# Patient Record
Sex: Male | Born: 1940 | Race: White | Hispanic: No | Smoking: Former smoker
Health system: Southern US, Community
[De-identification: ages and names within clinical notes are randomized; demographics above are authoritative.]

## PROBLEM LIST (undated history)

## (undated) DIAGNOSIS — J449 Chronic obstructive pulmonary disease, unspecified: Secondary | ICD-10-CM

## (undated) DIAGNOSIS — D689 Coagulation defect, unspecified: Secondary | ICD-10-CM

## (undated) DIAGNOSIS — T7840XA Allergy, unspecified, initial encounter: Secondary | ICD-10-CM

## (undated) DIAGNOSIS — H269 Unspecified cataract: Secondary | ICD-10-CM

## (undated) DIAGNOSIS — Z972 Presence of dental prosthetic device (complete) (partial): Secondary | ICD-10-CM

## (undated) DIAGNOSIS — E119 Type 2 diabetes mellitus without complications: Secondary | ICD-10-CM

## (undated) DIAGNOSIS — E7211 Homocystinuria: Secondary | ICD-10-CM

## (undated) DIAGNOSIS — E039 Hypothyroidism, unspecified: Secondary | ICD-10-CM

## (undated) DIAGNOSIS — R0600 Dyspnea, unspecified: Secondary | ICD-10-CM

## (undated) DIAGNOSIS — I739 Peripheral vascular disease, unspecified: Secondary | ICD-10-CM

## (undated) DIAGNOSIS — D649 Anemia, unspecified: Secondary | ICD-10-CM

## (undated) HISTORY — DX: Coagulation defect, unspecified: D68.9

## (undated) HISTORY — DX: Chronic obstructive pulmonary disease, unspecified: J44.9

## (undated) HISTORY — PX: APPENDECTOMY: SHX54

## (undated) HISTORY — DX: Type 2 diabetes mellitus without complications: E11.9

## (undated) HISTORY — PX: CORONARY ANGIOPLASTY WITH STENT PLACEMENT: SHX49

## (undated) HISTORY — PX: COLONOSCOPY: SHX174

## (undated) HISTORY — DX: Unspecified cataract: H26.9

## (undated) HISTORY — DX: Allergy, unspecified, initial encounter: T78.40XA

## (undated) HISTORY — DX: Anemia, unspecified: D64.9

---

## 1994-11-12 HISTORY — PX: FEMORAL-POPLITEAL BYPASS GRAFT: SHX937

## 1994-11-12 HISTORY — PX: ABDOMINAL AORTIC ENDOVASCULAR STENT GRAFT: SHX5707

## 1999-11-13 HISTORY — PX: CAROTID-SUBCLAVIAN BYPASS GRAFT: SHX910

## 2004-09-30 ENCOUNTER — Emergency Department: Payer: Self-pay | Admitting: Emergency Medicine

## 2005-10-16 ENCOUNTER — Ambulatory Visit: Payer: Self-pay | Admitting: Internal Medicine

## 2007-02-12 ENCOUNTER — Ambulatory Visit: Payer: Self-pay | Admitting: Vascular Surgery

## 2007-06-16 ENCOUNTER — Ambulatory Visit: Payer: Self-pay | Admitting: Gastroenterology

## 2007-06-26 ENCOUNTER — Ambulatory Visit: Payer: Self-pay | Admitting: Gastroenterology

## 2009-06-12 IMAGING — CT CT CHEST-ABD-PELV W/ CM
1 of 2 series · 13 of 29 positions shown, 17 images · non-contrast
Comparison: none

REASON FOR EXAM: Weight loss, patient is diabetic and on Metformin
COMMENTS:    Allergic to shellfish

[Series 2: soft tissue · axial · 0.83mm/px · z∈[-551,+44]mm · 13 of 135 slices shown, 17 images]
[im 8/135  mediastinal]
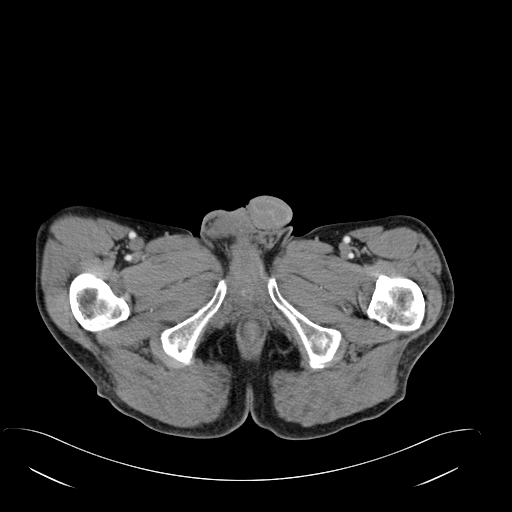
[im 8/135  bone]
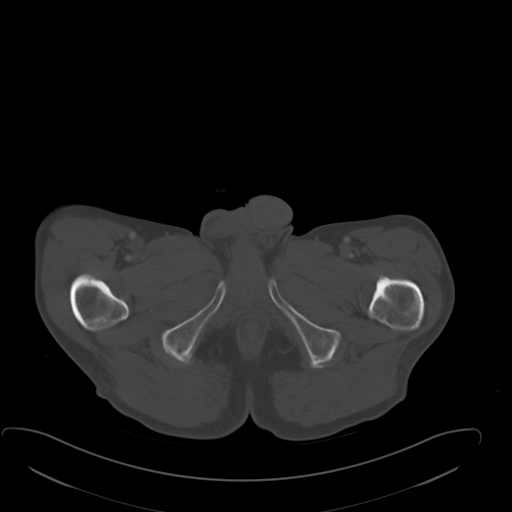
[im 23/135  mediastinal]
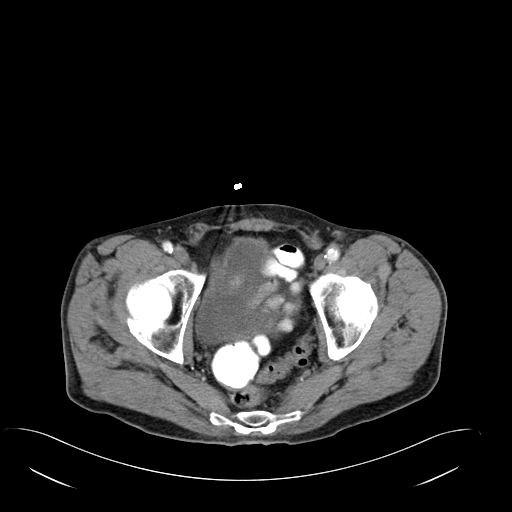
[im 38/135  mediastinal]
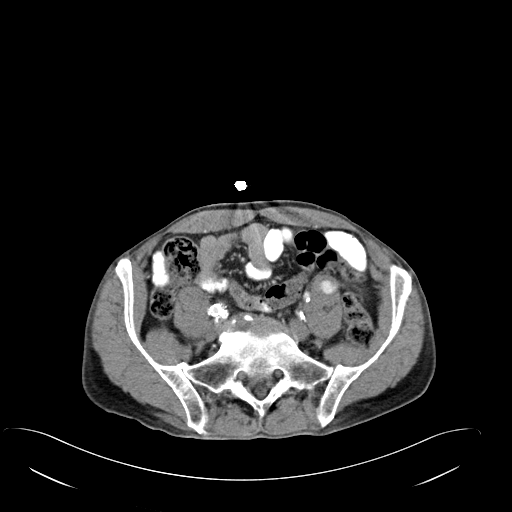
[im 45/135  mediastinal]
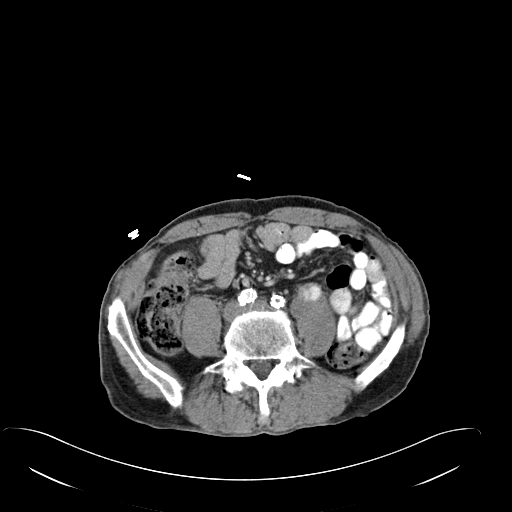
[im 60/135  mediastinal]
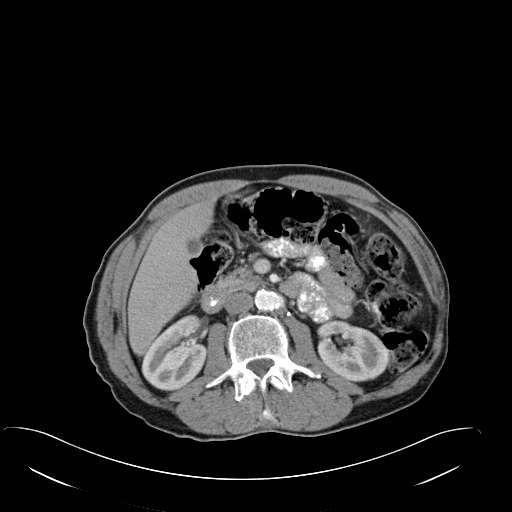
[im 68/135  mediastinal]
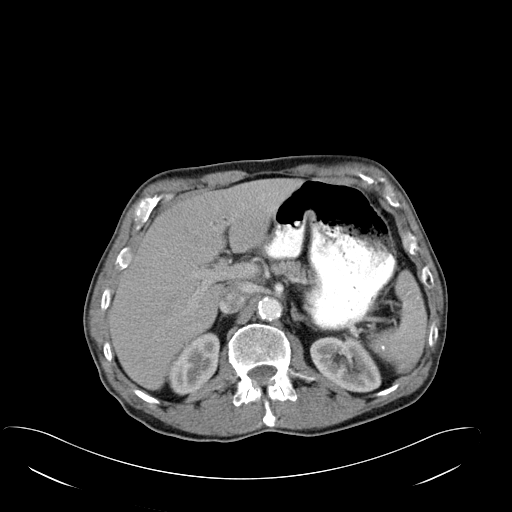
[im 75/135  mediastinal]
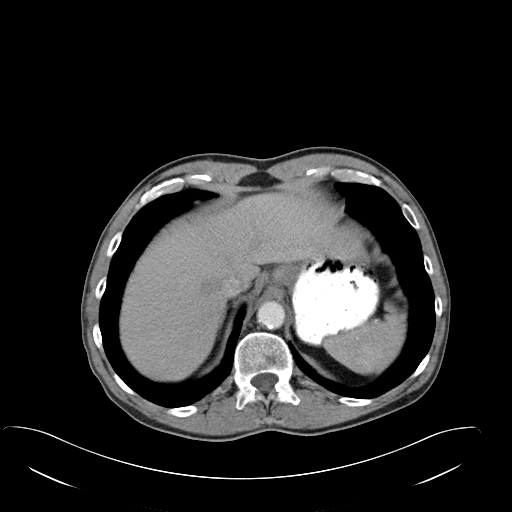
[im 90/135  mediastinal]
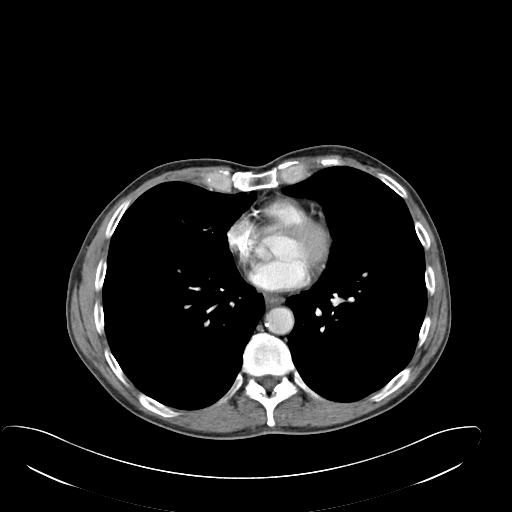
[im 97/135  mediastinal]
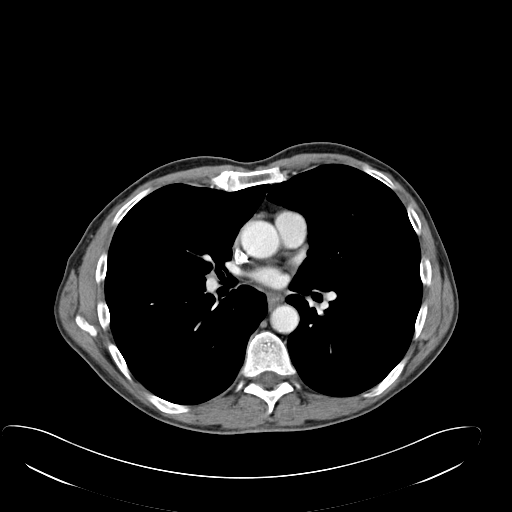
[im 97/135  bone]
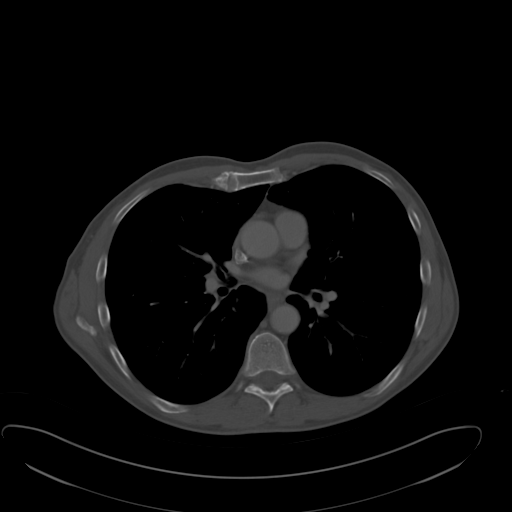
[im 105/135  lung]
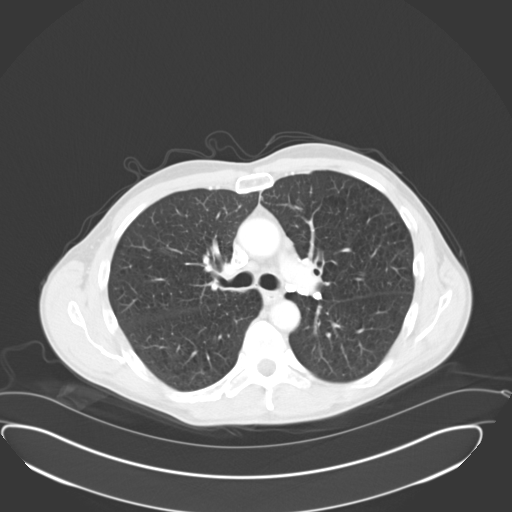
[im 112/135  mediastinal]
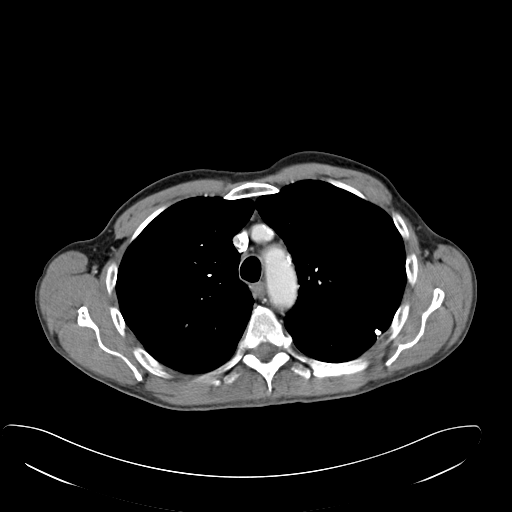
[im 112/135  lung]
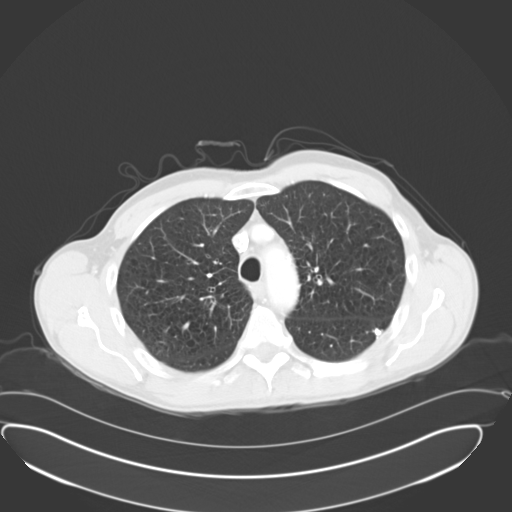
[im 120/135  lung]
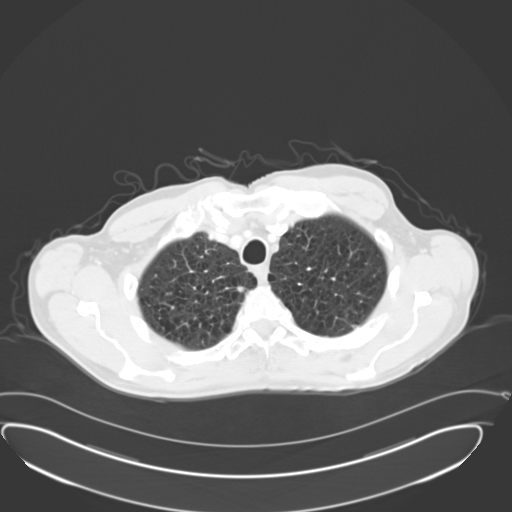
[im 127/135  mediastinal]
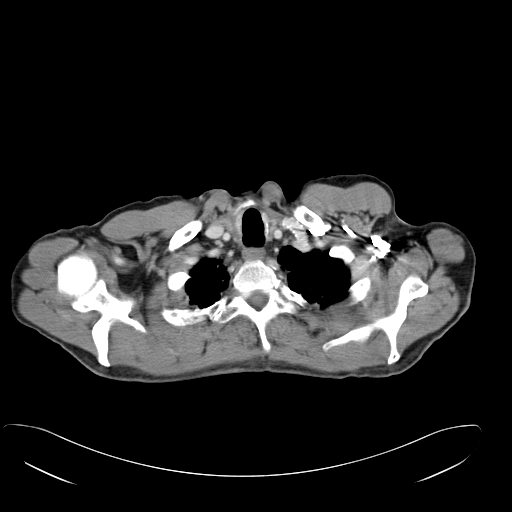
[im 127/135  lung]
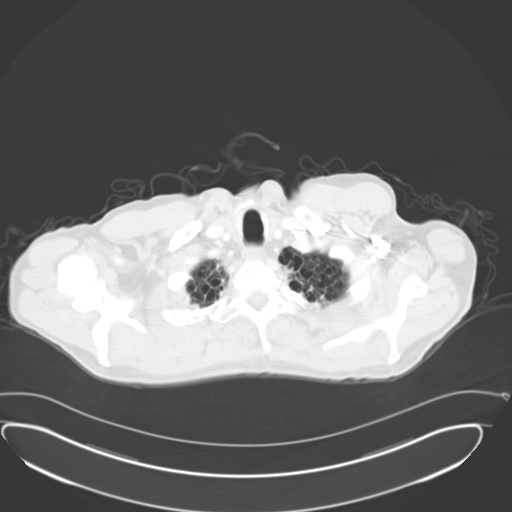

[13 of 29 positions shown; findings below may reference images not displayed]

PROCEDURE:     CT  - CT CHEST ABDOMEN AND PELVIS W  - June 26, 2007  [DATE]

RESULT:     The patient is being evaluated for unexplained weight loss. The
patient underwent a desensitization protocol due to shellfish allergy.

CT SCAN OF CHEST:  The patient received 85 ml of Csovue-OOZ for this study.

Within the mediastinum and hilar regions no pathologic size lymph nodes are
seen. No masses are identified. The cardiac chambers are not enlarged. The
caliber of the thoracic aorta is normal. The retrosternal soft tissues are
normal in appearance. There is no axillary lymphadenopathy. There is no
pleural or pericardial effusion.

At lung window settings, there are emphysematous changes bilaterally. There
is apical pleural scarring. There is a calcified, subpleural nodule in the
LEFT upper lobe demonstrated best on image #17. A second, calcified,
subpleural nodule in the extreme anterolateral aspect of the superior
segment of the LEFT lower lobe is seen on image #24. No alveolar infiltrates
or soft tissue density masses are identified.

The thoracic vertebral bodies are preserved in height. No abnormal
paravertebral soft tissue densities are seen.
CONCLUSION: I do not see findings to suggest intrathoracic malignancy or
other acute intrathoracic abnormality. There is evidence of prior
granulomatous infection of the lungs and there are changes of COPD with
small bullous lesions in the apices.

CT SCAN OF ABDOMEN AND PELVIS: The patient received the aforementioned IV
contrast as well as oral contrast material.

The liver exhibits scattered calcifications consistent with prior
granulomatous infection. There is no evidence of a soft tissue density mass
or ductal dilation. The spleen is normal in size and also exhibits punctate
calcifications. The stomach, pancreas, gallbladder, adrenal glands and
kidneys are normal in appearance. The caliber of the thoracic aorta is
within the limits of normal. The patient may have undergone prior vascular
grafting with there being a vessel present extending from the distal
abdominal aorta to the external iliac artery. There is dense calcification
in the wall of the common iliac arteries as well as the internal and
external iliac arteries with no definite residual flow seen in the native
external iliac artery on the LEFT.

Within the pelvis, there is no free fluid. The prostate gland produces a
moderate impression upon the urinary bladder base. The partially contrast
filled loops of small bowel are normal in appearance. The large bowel
contains stool and gas in a relatively normal fashion with no evidence of
obstruction. None of the orally administered contrast has reached the colon,
however. I do not see pelvic side-wall lymphadenopathy and no inguinal
lymphadenopathy is demonstrated.

The lumbar vertebral bodies are preserved in height.
IMPRESSION: 1.  Please see the discussion above regarding the findings in the thorax.
2.  Within the abdomen and pelvis, I do not see findings suspicious for
malignancy or other acute abnormality. Evaluation of the colon is somewhat
limited due to the lack of any of the orally administered contrast present.
However, the stool and gas pattern within the colon is felt to be within the
limits of normal.
2.  The patient has undergone prior vascular grafting on the LEFT from the
distal abdominal aorta to the external iliac artery/common femoral artery.
3. There is enlargement of the prostate gland.
4.  The observed portions of the bony structures exhibit no definite acute
abnormality. Nuclear Bone Scan may be useful if the patient's weight loss
remains unexplained.

## 2010-08-12 ENCOUNTER — Ambulatory Visit: Payer: Self-pay | Admitting: Internal Medicine

## 2011-02-05 DIAGNOSIS — E7211 Homocystinuria: Secondary | ICD-10-CM | POA: Insufficient documentation

## 2012-08-21 ENCOUNTER — Ambulatory Visit: Payer: Self-pay | Admitting: Internal Medicine

## 2012-11-12 HISTORY — PX: LEG AMPUTATION BELOW KNEE: SHX694

## 2015-04-25 ENCOUNTER — Other Ambulatory Visit: Payer: Self-pay | Admitting: Internal Medicine

## 2015-04-25 ENCOUNTER — Encounter: Payer: Self-pay | Admitting: Internal Medicine

## 2015-04-25 DIAGNOSIS — D531 Other megaloblastic anemias, not elsewhere classified: Secondary | ICD-10-CM | POA: Insufficient documentation

## 2015-04-25 DIAGNOSIS — S88112A Complete traumatic amputation at level between knee and ankle, left lower leg, initial encounter: Secondary | ICD-10-CM | POA: Insufficient documentation

## 2015-04-25 DIAGNOSIS — G5600 Carpal tunnel syndrome, unspecified upper limb: Secondary | ICD-10-CM | POA: Insufficient documentation

## 2015-04-25 DIAGNOSIS — E782 Mixed hyperlipidemia: Secondary | ICD-10-CM | POA: Insufficient documentation

## 2015-04-25 DIAGNOSIS — E1151 Type 2 diabetes mellitus with diabetic peripheral angiopathy without gangrene: Secondary | ICD-10-CM | POA: Insufficient documentation

## 2015-04-25 DIAGNOSIS — I739 Peripheral vascular disease, unspecified: Secondary | ICD-10-CM | POA: Insufficient documentation

## 2015-04-25 DIAGNOSIS — Z9229 Personal history of other drug therapy: Secondary | ICD-10-CM | POA: Insufficient documentation

## 2015-04-25 DIAGNOSIS — E039 Hypothyroidism, unspecified: Secondary | ICD-10-CM | POA: Insufficient documentation

## 2015-04-25 DIAGNOSIS — J439 Emphysema, unspecified: Secondary | ICD-10-CM | POA: Insufficient documentation

## 2015-04-25 DIAGNOSIS — L989 Disorder of the skin and subcutaneous tissue, unspecified: Secondary | ICD-10-CM | POA: Insufficient documentation

## 2015-04-25 DIAGNOSIS — F17210 Nicotine dependence, cigarettes, uncomplicated: Secondary | ICD-10-CM | POA: Insufficient documentation

## 2015-04-26 ENCOUNTER — Encounter: Payer: Self-pay | Admitting: Internal Medicine

## 2015-04-26 ENCOUNTER — Ambulatory Visit (INDEPENDENT_AMBULATORY_CARE_PROVIDER_SITE_OTHER): Payer: Medicare Other | Admitting: Internal Medicine

## 2015-04-26 ENCOUNTER — Other Ambulatory Visit: Payer: Self-pay | Admitting: Internal Medicine

## 2015-04-26 VITALS — BP 128/68 | HR 68 | Ht 68.0 in | Wt 125.4 lb

## 2015-04-26 DIAGNOSIS — Z9229 Personal history of other drug therapy: Secondary | ICD-10-CM

## 2015-04-26 DIAGNOSIS — I739 Peripheral vascular disease, unspecified: Secondary | ICD-10-CM | POA: Diagnosis not present

## 2015-04-26 DIAGNOSIS — J439 Emphysema, unspecified: Secondary | ICD-10-CM

## 2015-04-26 NOTE — Progress Notes (Signed)
Date:  04/26/2015   Name:  Ruben KNECHT Sr.   DOB:  03-12-1941   MRN:  [id number]   History of Present Illness:  This is a 74 y.o. male who is presenting for follow up of warfarin therapy.  Current dose is 7.5 mg 5 days per week and 3.75 mg 2 days per week. He is currently doing well - no bleeding issues noted.  No problems with medication compliance.  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  Because of the stump he has a bony protrusion that causes pressure and pain when he walks.  He had a Management consultant and is a good candidate for a prosthetic foot with an hydraulic ankle.  Review of Systems:  Review of Systems  Constitutional: Negative for appetite change and unexpected weight change.  HENT: Negative for nosebleeds.   Respiratory: Positive for shortness of breath. Negative for cough, chest tightness and wheezing.   Cardiovascular: Negative for chest pain.  Gastrointestinal: Negative for blood in stool.  Genitourinary: Negative for hematuria.  Neurological: Negative for dizziness.  Psychiatric/Behavioral: Negative for dysphoric mood.    Patient Active Problem List   Diagnosis Date Noted  . Acquired hypothyroidism 04/25/2015  . 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 History of anticoagulant therapy 04/25/2015  . 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  . DM (diabetes mellitus), type 2 with peripheral vascular complications 04/25/2015  . 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 Chronic obstructive pulmonary emphysema 04/25/2015  . Disorder of subcutaneous tissue 04/25/2015  . Angiopathy, peripheral 04/25/2015  . Cigarette smoker 04/25/2015  . Hyperhomocysteinemia 02/05/2011    Prior to Admission medications   Medication Sig Start Date End Date Taking? Authorizing Provider  glucose blood (ONE TOUCH ULTRA TEST) test strip   06/01/13  Yes Historical Provider, MD  levothyroxine (SYNTHROID, LEVOTHROID) 112 MCG tablet Take 1 tablet by mouth daily. 01/31/15  Yes Historical Provider, MD  metFORMIN (GLUCOPHAGE) 500 MG tablet Take 1 tablet by mouth 2 (two) times daily. 06/21/14  Yes Historical Provider, MD  tiotropium (SPIRIVA HANDIHALER) 18 MCG inhalation capsule Place 18 mcg into inhaler and inhale daily. 03/28/15  Yes Historical Provider, MD  warfarin (COUMADIN) 7.5 MG tablet Take 1 tablet by mouth daily. 03/27/15  Yes Historical Provider, MD  albuterol (VENTOLIN HFA) 108 (90 BASE) MCG/ACT inhaler Inhale 2 puffs into the lungs 4 (four) times daily as needed. 02/05/11   Historical Provider, MD  Fluticasone-Salmeterol (ADVAIR DISKUS) 250-50 MCG/DOSE AEPB Inhale 1 puff into the lungs 2 (two) times daily. 02/01/14   Historical Provider, MD  Umeclidinium-Vilanterol (ANORO ELLIPTA) 62.5-25 MCG/INH AEPB Inhale 1 Inhaler into the lungs daily. 02/11/15   Historical Provider, MD    Allergies  Allergen Reactions  . Iodinated Diagnostic Agents Hives  . Penicillins Hives  . Shellfish-Derived Products     Past Surgical History  Procedure Laterality Date  . Leg amputation below knee Left 2014  . Femoral-popliteal bypass graft Left 1996  . Carotid-subclavian bypass graft Left 2001  . Abdominal aortic endovascular stent graft  1996  . Appendectomy    . Coronary angioplasty with stent placement  1992, 2004    History  Substance Use Topics  . Smoking status: Current Every Day Smoker  . Smokeless tobacco: Not on file  . Alcohol Use: No     Medication list has been reviewed and updated.  Physical Examination:  Physical Exam  Constitutional: He appears well-developed.  Neck: Neck supple.  Cardiovascular: Normal rate, regular rhythm and normal heart sounds.   Pulmonary/Chest: Effort normal. No respiratory distress. He has decreased breath sounds. He has no wheezes.  Musculoskeletal:       Right ankle: He exhibits abnormal  pulse. He exhibits normal range of motion and no swelling.  Left BKA - stump intact, slight red skin no ulcer    BP 128/68 mmHg  Pulse 68  Ht 5\' 8"  (1.727 m)  Wt 125 lb 6.4 oz (56.881 kg)  BMI 19.07 kg/m2  Assessment and Plan: 1. Pulmonary emphysema, unspecified emphysema type anoro and Spiriva respiclick not covered Continue advair and ventolin  2. History of anticoagulant therapy Check INR today - INR/PT; Standing  3. Angiopathy, peripheral S/p left BKA Rx for "prosthetic foot with hydraulic ankle" written - INR/PT; Standing   Bari Edward, MD [facility]  04/26/2015

## 2015-04-27 LAB — PROTIME-INR
INR: 4.1 — ABNORMAL HIGH (ref 0.8–1.2)
Prothrombin Time: 42.6 s — ABNORMAL HIGH (ref 9.1–12.0)

## 2015-07-22 ENCOUNTER — Ambulatory Visit (INDEPENDENT_AMBULATORY_CARE_PROVIDER_SITE_OTHER): Payer: Medicare Other | Admitting: Internal Medicine

## 2015-07-22 ENCOUNTER — Encounter: Payer: Self-pay | Admitting: Internal Medicine

## 2015-07-22 VITALS — BP 94/54 | HR 96 | Ht 68.0 in | Wt 125.4 lb

## 2015-07-22 DIAGNOSIS — D531 Other megaloblastic anemias, not elsewhere classified: Secondary | ICD-10-CM | POA: Diagnosis not present

## 2015-07-22 DIAGNOSIS — E1159 Type 2 diabetes mellitus with other circulatory complications: Secondary | ICD-10-CM

## 2015-07-22 DIAGNOSIS — I739 Peripheral vascular disease, unspecified: Secondary | ICD-10-CM | POA: Diagnosis not present

## 2015-07-22 DIAGNOSIS — J439 Emphysema, unspecified: Secondary | ICD-10-CM | POA: Diagnosis not present

## 2015-07-22 DIAGNOSIS — E1151 Type 2 diabetes mellitus with diabetic peripheral angiopathy without gangrene: Secondary | ICD-10-CM

## 2015-07-22 DIAGNOSIS — Z9229 Personal history of other drug therapy: Secondary | ICD-10-CM

## 2015-07-22 MED ORDER — CYANOCOBALAMIN 1000 MCG/ML IJ SOLN
1000.0000 ug | INTRAMUSCULAR | Status: DC
  Administered 2015-07-22 – 2015-12-20 (×2): 1000 ug via INTRAMUSCULAR

## 2015-07-22 NOTE — Progress Notes (Signed)
Date:  07/22/2015   Name:  Ruben RAINERI Sr.   DOB:  1941/01/02   MRN:  [id number]   Chief Complaint: Diabetes and Anticoagulation Diabetes He presents for his follow-up diabetic visit. He has type 2 diabetes mellitus. His disease course has been stable. Pertinent negatives for hypoglycemia include no confusion. Pertinent negatives for diabetes include no chest pain and no fatigue. Diabetic complications include PVD. His weight is stable. He is following a diabetic diet. His breakfast blood glucose is taken between 7-8 am. His breakfast blood glucose range is generally 140-180 mg/dl.   neuropathic symptoms patient continues to have neuropathic inflammation of his legs. Since is status post bilateral BKA he doesn't actually have phantom pains. He describes it more as a general nerve irritation where his legs are jumpy. He's tried Neurontin without any benefit. It takes him 2-3 hours every night to get to sleep because of symptoms. Anticoagulation therapy - patient is doing well on warfarin. He is concerned that his INR might be dropping too low since he has not had bleeding issues that he had previously with minor cuts and abrasions. It's been about 4 months and since last check. We've been keeping his INR in 3-3-1/2 due to issues from his last amputation. COPD - patient continues to use his rescue inhaler as needed. He's been tried on multiple maintenance medications should been very costly. Because of his bilateral BKA's his activities are limited and he does not feel that his COPD is reducing his activity level. As a result he is not using any maintenance medicine only albuterol inhaler as needed. His primary symptom is some morning congestion which wants it clears and does not recur during the day. Review of Systems:  Review of Systems  Constitutional: Negative for fever, chills and fatigue.  HENT: Negative for nosebleeds and postnasal drip.   Respiratory: Positive for cough and shortness of  breath. Negative for chest tightness and wheezing.   Cardiovascular: Negative for chest pain and palpitations.  Gastrointestinal: Negative for anal bleeding.  Genitourinary: Negative for hematuria.  Psychiatric/Behavioral: Negative for confusion and decreased concentration.    Patient Active Problem List   Diagnosis Date Noted  . Acquired hypothyroidism 04/25/2015  . 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 History of anticoagulant therapy 04/25/2015  . 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  . DM (diabetes mellitus), type 2 with peripheral vascular complications 04/25/2015  . 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 Chronic obstructive pulmonary emphysema 04/25/2015  . Disorder of subcutaneous tissue 04/25/2015  . Angiopathy, peripheral 04/25/2015  . Cigarette smoker 04/25/2015  . Hyperhomocysteinemia 02/05/2011    Prior to Admission medications   Medication Sig Start Date End Date Taking? Authorizing Provider  albuterol (VENTOLIN HFA) 108 (90 BASE) MCG/ACT inhaler Inhale 2 puffs into the lungs 4 (four) times daily as needed. 02/05/11  Yes Historical Provider, MD  glucose blood (ONE TOUCH ULTRA TEST) test strip  06/01/13  Yes Historical Provider, MD  levothyroxine (SYNTHROID, LEVOTHROID) 112 MCG tablet Take 1 tablet by mouth daily. 01/31/15  Yes Historical Provider, MD  metFORMIN (GLUCOPHAGE) 500 MG tablet Take 1 tablet by mouth 2 (two) times daily. 06/21/14  Yes Historical Provider, MD  warfarin (COUMADIN) 7.5 MG tablet Take 1 tablet by mouth daily. 03/27/15  Yes Historical Provider, MD  Fluticasone-Salmeterol (ADVAIR DISKUS) 250-50 MCG/DOSE AEPB Inhale 1 puff into the lungs 2 (two) times daily. 02/01/14  Historical Provider, MD  tiotropium (SPIRIVA HANDIHALER) 18 MCG inhalation capsule Place 18 mcg into inhaler and inhale daily. 03/28/15   Historical Provider, MD   Umeclidinium-Vilanterol (ANORO ELLIPTA) 62.5-25 MCG/INH AEPB Inhale 1 Inhaler into the lungs daily. 02/11/15   Historical Provider, MD    Allergies  Allergen Reactions  . Iodinated Diagnostic Agents Hives  . Penicillins Hives  . Shellfish-Derived Products     Past Surgical History  Procedure Laterality Date  . Leg amputation below knee Left 2014  . Femoral-popliteal bypass graft Left 1996  . Carotid-subclavian bypass graft Left 2001  . Abdominal aortic endovascular stent graft  1996  . Appendectomy    . Coronary angioplasty with stent placement  1992, 2004    Social History  Substance Use Topics  . Smoking status: Current Every Day Smoker  . Smokeless tobacco: None  . Alcohol Use: No     Medication list has been reviewed and updated.  Physical Examination:  Physical Exam  Constitutional: He is oriented to person, place, and time. He appears well-developed and well-nourished. No distress.  HENT:  Head: Normocephalic and atraumatic.  Eyes: Conjunctivae are normal. Right eye exhibits no discharge. Left eye exhibits no discharge. No scleral icterus.  Cardiovascular: Normal rate, regular rhythm and normal heart sounds.   Pulmonary/Chest: Effort normal. No respiratory distress. He has no wheezes. He has no rales.  Lymphadenopathy:    He has no cervical adenopathy.  Neurological: He is alert and oriented to person, place, and time.  Skin: Skin is warm and dry. No rash noted.  Psychiatric: He has a normal mood and affect. His behavior is normal. Thought content normal.    BP 94/54 mmHg  Pulse 96  Ht 5\' 8"  (1.727 m)  Wt 125 lb 6.4 oz (56.881 kg)  BMI 19.07 kg/m2  Assessment and Plan: 1. DM (diabetes mellitus), type 2 with peripheral vascular complications Controlled, continue current medications - Hemoglobin A1c  2. 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  3. History of anticoagulant therapy We'll check INR, recommended more regular monitoring at least every 2 months - Protime-INR  4. Pulmonary emphysema, unspecified emphysema type Stable on rescue inhaler only Patient derived little benefit from maintenance medications and complains of excessive cost  5. Angiopathy, peripheral We'll try Horizon 300 mg at 8 PM nightly, samples were given. Call for prescription if helpful. - Protime-INR   Bari Edward, MD [facility]  07/22/2015

## 2015-07-23 ENCOUNTER — Other Ambulatory Visit: Payer: Self-pay | Admitting: Internal Medicine

## 2015-07-23 LAB — HEMOGLOBIN A1C
ESTIMATED AVERAGE GLUCOSE: 171 mg/dL
Hgb A1c MFr Bld: 7.6 % — ABNORMAL HIGH (ref 4.8–5.6)

## 2015-07-23 LAB — PROTIME-INR
INR: 2.8 — ABNORMAL HIGH (ref 0.8–1.2)
Prothrombin Time: 28.8 s — ABNORMAL HIGH (ref 9.1–12.0)

## 2015-07-23 MED ORDER — WARFARIN SODIUM 1 MG PO TABS: 1.0000 mg | ORAL_TABLET | ORAL | Status: DC

## 2015-07-24 ENCOUNTER — Other Ambulatory Visit: Payer: Self-pay | Admitting: Internal Medicine

## 2015-07-25 ENCOUNTER — Ambulatory Visit (INDEPENDENT_AMBULATORY_CARE_PROVIDER_SITE_OTHER): Payer: Medicare Other | Admitting: Podiatry

## 2015-07-25 ENCOUNTER — Ambulatory Visit (INDEPENDENT_AMBULATORY_CARE_PROVIDER_SITE_OTHER): Payer: Medicare Other

## 2015-07-25 ENCOUNTER — Encounter: Payer: Self-pay | Admitting: Podiatry

## 2015-07-25 VITALS — BP 116/69 | HR 79 | Resp 12

## 2015-07-25 DIAGNOSIS — I739 Peripheral vascular disease, unspecified: Secondary | ICD-10-CM | POA: Diagnosis not present

## 2015-07-25 DIAGNOSIS — M779 Enthesopathy, unspecified: Secondary | ICD-10-CM

## 2015-07-25 DIAGNOSIS — E1142 Type 2 diabetes mellitus with diabetic polyneuropathy: Secondary | ICD-10-CM

## 2015-07-25 NOTE — Progress Notes (Signed)
   Subjective:    Patient ID: Ruben Plum Sr., male    DOB: 06/27/1941, 74 y.o.   MRN: [id number]  HPI Ruben Wright presents today with a 6-8 month duration of numbness and pain at bedtime to his right foot. He states that he has been on Neurontin before for phantom pain after the below-knee amputation of his left leg. He states this did not think for him. He still had the pain. He states that he feels that it may be the leg length discrepancy that is working against him developing pain in the fourth and fifth toe area of the right foot. He states this is only at nighttime and takes him at least 2 hours to get comfortable before he can follow sleep. He has a blood clotting disorder    Review of Systems  Musculoskeletal: Positive for gait problem.       Objective:   Physical Exam: 74 year old male no acute distress pulses palpable right neurologic sensorium is slightly diminished per Semmes-Weinstein monofilament. Deep tendon reflexes are intact right muscle strength +5 over 5 dorsiflexion plantar flexors and inverters everters all just musculature is intact right. Orthopedic evaluation demonstrates rectus foot type on joints distal to the ankle for range of motion. Mild hallux valgus with some limitation range of motion of the first metatarsophalangeal joint of the right foot. Radiographs confirm hallux valgus and osteoarthritic changes at the level of the first metatarsophalangeal joint rectus foot type. Cutaneous evaluation and straight supple well-hydrated cutis no reactive hyperkeratoses no erythema edema saline as drainage or odor.        Assessment & Plan:  Assessment: Diabetic peripheral neuropathy right. Metatarsalgia and plantar fasciitis right.  Plan: He was scanned for single orthotic today to see if we can level out his leg length discrepancy.

## 2015-08-22 ENCOUNTER — Other Ambulatory Visit: Payer: Self-pay | Admitting: Internal Medicine

## 2015-08-30 ENCOUNTER — Telehealth: Payer: Self-pay | Admitting: *Deleted

## 2015-08-30 NOTE — Telephone Encounter (Signed)
Tammy can you please see if his orthotics are and if they are get him in to see me on Thursday.

## 2015-08-30 NOTE — Telephone Encounter (Signed)
Pt asked status of orthotics made over 1 month ago.

## 2015-08-30 NOTE — Telephone Encounter (Signed)
08/30/15- CALLED PT TO ADVISE ORTHOTICS HAVE COME IN. APPT MADE FOR THIS Thursday TO PICK THOSE UP WITH MELODY. TH

## 2015-09-01 ENCOUNTER — Ambulatory Visit (INDEPENDENT_AMBULATORY_CARE_PROVIDER_SITE_OTHER): Payer: Medicare Other | Admitting: Podiatry

## 2015-09-01 DIAGNOSIS — E1142 Type 2 diabetes mellitus with diabetic polyneuropathy: Secondary | ICD-10-CM

## 2015-09-01 DIAGNOSIS — I739 Peripheral vascular disease, unspecified: Secondary | ICD-10-CM

## 2015-09-01 DIAGNOSIS — M779 Enthesopathy, unspecified: Secondary | ICD-10-CM

## 2015-09-01 NOTE — Patient Instructions (Signed)

## 2015-09-01 NOTE — Progress Notes (Signed)
Patient ID: Ruben PlumWilliam T Price Sr., male   DOB: 1941/02/11, 74 y.o.   MRN: [id number] Patient presents for orthotic pick up. Patient received right orthotic only due to left foot amputation.  Verbal and written break in and wear instructions given.  Patient will follow up in 4 weeks with Dr. Al CorpusHyatt to be sure off load is protecting bony prominence.  He had no other complaints today. He was seen by the nurse and denied to be evaluated by myself.

## 2015-09-28 ENCOUNTER — Encounter: Payer: Self-pay | Admitting: Podiatry

## 2015-09-28 ENCOUNTER — Ambulatory Visit (INDEPENDENT_AMBULATORY_CARE_PROVIDER_SITE_OTHER): Payer: Medicare Other | Admitting: Podiatry

## 2015-09-28 DIAGNOSIS — M7741 Metatarsalgia, right foot: Secondary | ICD-10-CM | POA: Diagnosis not present

## 2015-09-28 DIAGNOSIS — I739 Peripheral vascular disease, unspecified: Secondary | ICD-10-CM

## 2015-09-28 DIAGNOSIS — M722 Plantar fascial fibromatosis: Secondary | ICD-10-CM

## 2015-09-28 DIAGNOSIS — E1142 Type 2 diabetes mellitus with diabetic polyneuropathy: Secondary | ICD-10-CM | POA: Diagnosis not present

## 2015-09-28 NOTE — Progress Notes (Signed)
He presents today for follow-up of his single orthotic to his right foot. He states that the heel hurts too much and I'm really not getting any relief along the lateral aspect of that right foot.  I evaluated the orthotics it appears that he is getting some irritation centrally located in the heel area of the right foot. I think the best thing to do would be to send these out and have extra padding applied as well as a metatarsal pad. Follow up with him once these are returned.

## 2015-09-29 ENCOUNTER — Ambulatory Visit: Payer: Medicare Other | Admitting: Podiatry

## 2015-09-29 ENCOUNTER — Ambulatory Visit: Payer: Medicare Other

## 2015-09-29 ENCOUNTER — Ambulatory Visit
Admission: EM | Admit: 2015-09-29 | Discharge: 2015-09-29 | Disposition: A | Discharge status: Home / Self Care | Payer: Medicare Other | Attending: Family Medicine | Admitting: Family Medicine

## 2015-09-29 ENCOUNTER — Encounter: Payer: Self-pay | Admitting: Emergency Medicine

## 2015-09-29 DIAGNOSIS — Z79899 Other long term (current) drug therapy: Secondary | ICD-10-CM | POA: Diagnosis not present

## 2015-09-29 DIAGNOSIS — J449 Chronic obstructive pulmonary disease, unspecified: Secondary | ICD-10-CM | POA: Diagnosis not present

## 2015-09-29 DIAGNOSIS — F17200 Nicotine dependence, unspecified, uncomplicated: Secondary | ICD-10-CM | POA: Insufficient documentation

## 2015-09-29 DIAGNOSIS — J069 Acute upper respiratory infection, unspecified: Secondary | ICD-10-CM

## 2015-09-29 DIAGNOSIS — Z880 Allergy status to penicillin: Secondary | ICD-10-CM | POA: Insufficient documentation

## 2015-09-29 DIAGNOSIS — R05 Cough: Secondary | ICD-10-CM | POA: Diagnosis present

## 2015-09-29 DIAGNOSIS — Z7901 Long term (current) use of anticoagulants: Secondary | ICD-10-CM | POA: Insufficient documentation

## 2015-09-29 MED ORDER — ALBUTEROL SULFATE HFA 108 (90 BASE) MCG/ACT IN AERS: 2.0000 | INHALATION_SPRAY | RESPIRATORY_TRACT | Status: DC | PRN

## 2015-09-29 MED ORDER — DOXYCYCLINE HYCLATE 100 MG PO CAPS: 100.0000 mg | ORAL_CAPSULE | Freq: Two times a day (BID) | ORAL | Status: DC

## 2015-09-29 MED ORDER — PREDNISONE 50 MG PO TABS: 50.0000 mg | ORAL_TABLET | Freq: Every day | ORAL | Status: DC

## 2015-09-29 NOTE — ED Provider Notes (Signed)
CSN: [id number]     Arrival date & time 09/29/15  1354 History   First MD Initiated Contact with Patient 09/29/15 1515     Chief Complaint  Patient presents with  . Cough   (Consider location/radiation/quality/duration/timing/severity/associated sxs/prior Treatment) HPI 74 year old male presents for evaluation of cough and chest congestion. He has been having this for about 2 weeks. He describes some chest congestion with a low-grade fever. He has sore throat initially but that has resolved. Symptoms have gotten slowly and gradually worse. No NVD, chest pain, or leg swelling. No orthopnea or dyspnea. No history of heart failure. He has history of pneumonia 2.  History reviewed. No pertinent past medical history. Past Surgical History  Procedure Laterality Date  . Leg amputation below knee Left 2014  . Femoral-popliteal bypass graft Left 1996  . Carotid-subclavian bypass graft Left 2001  . Abdominal aortic endovascular stent graft  1996  . Appendectomy    . Coronary angioplasty with stent placement  1992, 2004   Family History  Problem Relation Age of Onset  . CAD Mother   . Hypothyroidism Mother    Social History  Substance Use Topics  . Smoking status: Current Every Day Smoker  . Smokeless tobacco: None  . Alcohol Use: No    Review of Systems  Constitutional: Positive for chills. Negative for fever.  HENT: Positive for sore throat.   Respiratory: Positive for cough and chest tightness.   Cardiovascular: Negative for leg swelling.  Gastrointestinal: Negative for nausea and vomiting.  All other systems reviewed and are negative.   Allergies  Iodinated diagnostic agents; Penicillins; and Shellfish-derived products  Home Medications   Prior to Admission medications   Medication Sig Start Date End Date Taking? Authorizing Provider  albuterol (PROVENTIL HFA;VENTOLIN HFA) 108 (90 BASE) MCG/ACT inhaler Inhale 2 puffs into the lungs every 4 (four) hours as needed for  wheezing. 09/29/15   Graylon GoodZachary H Yahsir Wickens, PA-C  albuterol (VENTOLIN HFA) 108 (90 BASE) MCG/ACT inhaler Inhale 2 puffs into the lungs 4 (four) times daily as needed. 02/05/11   Historical Provider, MD  doxycycline (VIBRAMYCIN) 100 MG capsule Take 1 capsule (100 mg total) by mouth 2 (two) times daily. 09/29/15   Adrian BlackwaterZachary H Randy Whitener, PA-C  glucose blood (ONE TOUCH ULTRA TEST) test strip  06/01/13   Historical Provider, MD  levothyroxine (SYNTHROID, LEVOTHROID) 112 MCG tablet TAKE ONE TABLET BY MOUTH ONCE DAILY 08/22/15   Reubin MilanLaura H Berglund, MD  metFORMIN (GLUCOPHAGE) 500 MG tablet TAKE ONE TABLET BY MOUTH TWICE DAILY 07/24/15   Reubin MilanLaura H Berglund, MD  predniSONE (DELTASONE) 50 MG tablet Take 1 tablet (50 mg total) by mouth daily with breakfast. 09/29/15   Graylon GoodZachary H Sophiarose Eades, PA-C  warfarin (COUMADIN) 1 MG tablet Take 1 tablet (1 mg total) by mouth 3 (three) times a week. Take 1 mg on MWF along with 7.5 mg.  Continue 7.5 mg on all other days. 07/23/15   Reubin MilanLaura H Berglund, MD  warfarin (COUMADIN) 7.5 MG tablet Take 1 tablet by mouth daily. 03/27/15   Historical Provider, MD   Meds Ordered and Administered this Visit  Medications - No data to display  BP 131/56 mmHg  Pulse 72  Temp(Src) 96.2 F (35.7 C) (Tympanic)  Resp 16  Ht 5\' 8"  (1.727 m)  Wt 130 lb (58.968 kg)  BMI 19.77 kg/m2  SpO2 100% No data found.   Physical Exam  Constitutional: He is oriented to person, place, and time. He appears well-developed and well-nourished. No distress.  HENT:  Head: Normocephalic and atraumatic.  Right Ear: External ear normal.  Left Ear: External ear normal.  Nose: Nose normal.  Mouth/Throat: No oropharyngeal exudate.  Eyes: Conjunctivae are normal.  Neck: Normal range of motion. No JVD present. No tracheal deviation present.  Cardiovascular: Normal rate, regular rhythm and normal heart sounds.   Pulmonary/Chest: Effort normal. No respiratory distress. He has rales (right middle lung field).  Abdominal: Soft. He  exhibits no distension. There is no tenderness.  Neurological: He is alert and oriented to person, place, and time. Coordination normal.  Skin: Skin is warm and dry. No rash noted. He is not diaphoretic.  Psychiatric: He has a normal mood and affect. Judgment normal.  Nursing note and vitals reviewed.   ED Course  Procedures (including critical care time)  Labs Review Labs Reviewed - No data to display  Imaging Review Dg Chest 2 View  09/29/2015  CLINICAL DATA:  Cough and shortness of Breath for 2 weeks EXAM: CHEST - 2 VIEW COMPARISON:  08/21/2012 FINDINGS: Cardiac shadow is stable. The lungs are again hyperinflated consistent with COPD. Changes of prior granulomatous disease are again noted. No focal infiltrate or sizable effusion is seen. No acute bony abnormality is noted. IMPRESSION: COPD without acute abnormality. Electronically Signed   By: Alcide Clever M.D.   On: 09/29/2015 15:47       MDM   1. URI (upper respiratory infection)     CXR consistent with COPD.  Treat today with prednisone, albuterol inhaler, ABx.  Follow up if no improvement in a few days, ED if worsening.    New Prescriptions   ALBUTEROL (PROVENTIL HFA;VENTOLIN HFA) 108 (90 BASE) MCG/ACT INHALER    Inhale 2 puffs into the lungs every 4 (four) hours as needed for wheezing.   DOXYCYCLINE (VIBRAMYCIN) 100 MG CAPSULE    Take 1 capsule (100 mg total) by mouth 2 (two) times daily.   PREDNISONE (DELTASONE) 50 MG TABLET    Take 1 tablet (50 mg total) by mouth daily with breakfast.       Graylon Good, PA-C 09/29/15 1558

## 2015-09-29 NOTE — ED Notes (Signed)
Patient c/o cough and chest congestion for 2 weeks.   

## 2015-10-17 ENCOUNTER — Encounter: Payer: Self-pay | Admitting: Podiatry

## 2015-10-20 ENCOUNTER — Encounter: Payer: Self-pay | Admitting: Internal Medicine

## 2015-10-20 ENCOUNTER — Ambulatory Visit (INDEPENDENT_AMBULATORY_CARE_PROVIDER_SITE_OTHER): Payer: Medicare Other | Admitting: Internal Medicine

## 2015-10-20 VITALS — BP 104/50 | HR 80 | Ht 68.0 in | Wt 127.6 lb

## 2015-10-20 DIAGNOSIS — I739 Peripheral vascular disease, unspecified: Secondary | ICD-10-CM

## 2015-10-20 DIAGNOSIS — D531 Other megaloblastic anemias, not elsewhere classified: Secondary | ICD-10-CM | POA: Diagnosis not present

## 2015-10-20 DIAGNOSIS — Z9229 Personal history of other drug therapy: Secondary | ICD-10-CM | POA: Diagnosis not present

## 2015-10-20 DIAGNOSIS — Z7901 Long term (current) use of anticoagulants: Secondary | ICD-10-CM | POA: Diagnosis not present

## 2015-10-20 DIAGNOSIS — J439 Emphysema, unspecified: Secondary | ICD-10-CM | POA: Diagnosis not present

## 2015-10-20 MED ORDER — MOMETASONE FUROATE 100 MCG/ACT IN AERO: 200.0000 ug | INHALATION_SPRAY | Freq: Two times a day (BID) | RESPIRATORY_TRACT | Status: DC

## 2015-10-20 MED ORDER — CYANOCOBALAMIN 1000 MCG/ML IJ SOLN
1000.0000 ug | Freq: Once | INTRAMUSCULAR | Status: AC
  Administered 2015-10-20: 1000 ug via INTRAMUSCULAR

## 2015-10-20 NOTE — Patient Instructions (Addendum)
Claritin 10 mg once a day or Allegra 180 mg once a day for runny nose and post nasal drainage.  Mucinex (plain) take twice a day to thin mucus.  Asmanex HFA - 2 puffs twice a day.

## 2015-10-20 NOTE — Progress Notes (Signed)
Date:  10/20/2015   Name:  Ruben PlumWilliam T Tramontana Sr.   DOB:  Aug 16, 1941   MRN:  [id number]   Chief Complaint: Sinusitis  Patient was seen about 3 weeks ago at urgent care with cough and congestion. His chest x-ray was consistent with COPD. He was treated with doxycycline and a prednisone taper. He continues to have some chest congestion and coughing up thick white phlegm. Over the past few days he developed a runny nose with drip and postnasal drainage is clear. He's had no fever or chills. He does not take COPD maintenance medication.  Warfarin therapy - 3.75 mg Wed and Sun, 7.5 mg all other days. He denies bleeding or excessive bruising issues. His peripheral vascular disease has been stable with no recent interventions required.  Review of Systems  Constitutional: Negative for fever, chills and fatigue.  HENT: Positive for congestion, postnasal drip, rhinorrhea and sneezing. Negative for tinnitus, trouble swallowing and voice change.   Respiratory: Positive for cough and shortness of breath. Negative for chest tightness and wheezing.   Cardiovascular: Negative for chest pain and palpitations.  Gastrointestinal: Negative for abdominal pain.  Neurological: Negative for dizziness and headaches.  Hematological: Negative for adenopathy. Does not bruise/bleed easily.    Patient Active Problem List   Diagnosis Date Noted  . Acquired hypothyroidism 04/25/2015  . 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 History of anticoagulant therapy 04/25/2015  . 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  . DM (diabetes mellitus), 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/2015  . 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  . Disorder of subcutaneous tissue 04/25/2015  . Angiopathy, 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  . Cigarette smoker  04/25/2015  . 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    Prior to Admission medications   Medication Sig Start Date End Date Taking? Authorizing Provider  albuterol (VENTOLIN HFA) 108 (90 BASE) MCG/ACT inhaler Inhale 2 puffs into the lungs 4 (four) times daily as needed. 02/05/11  Yes Historical Provider, MD  glucose blood (ONE TOUCH ULTRA TEST) test strip  06/01/13  Yes Historical Provider, MD  levothyroxine (SYNTHROID, LEVOTHROID) 112 MCG tablet TAKE ONE TABLET BY MOUTH ONCE DAILY 08/22/15  Yes Reubin MilanLaura H Kesley Mullens, MD  metFORMIN (GLUCOPHAGE) 500 MG tablet TAKE ONE TABLET BY MOUTH TWICE DAILY 07/24/15  Yes Reubin MilanLaura H Jenavie Stanczak, MD  warfarin (COUMADIN) 7.5 MG tablet Take 1 tablet by mouth daily. 03/27/15  Yes Historical Provider, MD  doxycycline (VIBRAMYCIN) 100 MG capsule Take 1 capsule (100 mg total) by mouth 2 (two) times daily. Patient not taking: Reported on 10/20/2015 09/29/15   Graylon GoodZachary H Baker, PA-C  predniSONE (DELTASONE) 50 MG tablet Take 1 tablet (50 mg total) by mouth daily with breakfast. Patient not taking: Reported on 10/20/2015 09/29/15   Graylon GoodZachary H Baker, PA-C  warfarin (COUMADIN) 1 MG tablet Take 1 tablet (1 mg total) by mouth 3 (three) times a week. Take 1 mg on MWF along with 7.5 mg.  Continue 7.5 mg on all other days. Patient not taking: Reported on 10/20/2015 07/23/15   Reubin MilanLaura H Briony Parveen, MD    Allergies  Allergen Reactions  . Iodinated Diagnostic Agents Hives  . Penicillins Hives  . Shellfish-Derived Products     Past Surgical History  Procedure Laterality Date  . Leg amputation below knee Left 2014  . Femoral-popliteal bypass graft Left 1996  . Carotid-subclavian bypass graft Left 2001  .  Abdominal aortic endovascular stent graft  1996  . Appendectomy    . Coronary angioplasty with stent placement  1992, 2004    Social History  Substance Use Topics  . Smoking status: Current Every Day Smoker  . Smokeless tobacco: None  . Alcohol Use: No    Medication list has been  reviewed and updated.   Physical Exam  Constitutional: He appears well-developed and well-nourished.  HENT:  Right Ear: Tympanic membrane and ear canal normal.  Left Ear: Tympanic membrane and ear canal normal.  Mouth/Throat: No posterior oropharyngeal edema or posterior oropharyngeal erythema.  Neck: Neck supple. Carotid bruit is not present.  Cardiovascular: Normal rate, regular rhythm and normal heart sounds.   Pulmonary/Chest: Effort normal. He has decreased breath sounds. He has no wheezes. He has no rhonchi.  Nursing note and vitals reviewed.   BP 104/50 mmHg  Pulse 80  Ht  (1.727 m)  Wt 127 lb 9.6 oz (57.879 kg)  BMI 19.41 kg/m2  Assessment and Plan: 1. Pulmonary emphysema, unspecified emphysema type (HCC) Recently treated with prednisone and doxycycline for exacerbation Will add steroid inhaler to reduce airway inflammation and mucus production Begin Mucinex twice a day Begin Claritin or Allegra for postnasal drip - Mometasone Furoate (ASMANEX HFA) 100 MCG/ACT AERO; Inhale 200 mcg into the lungs 2 (two) times daily.  Dispense: 1 Inhaler; Refill: 0  2. Angiopathy, peripheral (HCC) Stable on chronic anticoagulation - INR/PT  3. 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   Bari Edward, MD [facility]  10/20/2015

## 2015-10-21 LAB — PROTIME-INR
INR: 2.6 — ABNORMAL HIGH (ref 0.8–1.2)
PROTHROMBIN TIME: 26.3 s — AB (ref 9.1–12.0)

## 2015-10-26 ENCOUNTER — Other Ambulatory Visit: Payer: Self-pay | Admitting: Internal Medicine

## 2015-10-26 ENCOUNTER — Telehealth: Payer: Self-pay

## 2015-10-26 ENCOUNTER — Ambulatory Visit (INDEPENDENT_AMBULATORY_CARE_PROVIDER_SITE_OTHER): Payer: Medicare Other | Admitting: Podiatry

## 2015-10-26 VITALS — BP 115/60 | HR 87 | Resp 18

## 2015-10-26 DIAGNOSIS — J441 Chronic obstructive pulmonary disease with (acute) exacerbation: Secondary | ICD-10-CM

## 2015-10-26 DIAGNOSIS — M7741 Metatarsalgia, right foot: Secondary | ICD-10-CM

## 2015-10-26 MED ORDER — METHYLPREDNISOLONE 4 MG PO TBPK: ORAL_TABLET | ORAL | Status: DC

## 2015-10-26 MED ORDER — CEFDINIR 300 MG PO CAPS: 300.0000 mg | ORAL_CAPSULE | Freq: Two times a day (BID) | ORAL | Status: DC

## 2015-10-26 NOTE — Progress Notes (Signed)
He presents today to pick up his orthotics he was given both oral and written home going instructions for care and use of them I will follow-up with him in 1 month if necessary.

## 2015-10-26 NOTE — Telephone Encounter (Signed)
Patient is not getting better, thinks he needs another round of Prednisone and antibiotic. OTC  That you told him to take is not working. He actually feels like he is getting worse.dr

## 2015-12-20 ENCOUNTER — Ambulatory Visit (INDEPENDENT_AMBULATORY_CARE_PROVIDER_SITE_OTHER): Payer: Medicare Other | Admitting: Internal Medicine

## 2015-12-20 ENCOUNTER — Encounter: Payer: Self-pay | Admitting: Internal Medicine

## 2015-12-20 VITALS — BP 138/56 | HR 64 | Ht 68.0 in | Wt 131.6 lb

## 2015-12-20 DIAGNOSIS — Z9229 Personal history of other drug therapy: Secondary | ICD-10-CM

## 2015-12-20 DIAGNOSIS — E1151 Type 2 diabetes mellitus with diabetic peripheral angiopathy without gangrene: Secondary | ICD-10-CM

## 2015-12-20 DIAGNOSIS — D531 Other megaloblastic anemias, not elsewhere classified: Secondary | ICD-10-CM | POA: Diagnosis not present

## 2015-12-20 DIAGNOSIS — Z720 Tobacco use: Secondary | ICD-10-CM | POA: Diagnosis not present

## 2015-12-20 DIAGNOSIS — E039 Hypothyroidism, unspecified: Secondary | ICD-10-CM

## 2015-12-20 DIAGNOSIS — I739 Peripheral vascular disease, unspecified: Secondary | ICD-10-CM

## 2015-12-20 DIAGNOSIS — F17210 Nicotine dependence, cigarettes, uncomplicated: Secondary | ICD-10-CM

## 2015-12-20 MED ORDER — BUPROPION HCL ER (SR) 150 MG PO TB12: 150.0000 mg | ORAL_TABLET | Freq: Two times a day (BID) | ORAL | Status: DC

## 2015-12-20 NOTE — Progress Notes (Signed)
Date:  12/20/2015   Name:  Ruben MIRABAL Sr.   DOB:  Sep 17, 1941   MRN:  [id number]   Chief Complaint: Diabetes; Anticoagulation; and Hypothyroidism Diabetes He presents for his follow-up diabetic visit. He has type 2 diabetes mellitus. Pertinent negatives for diabetes include no chest pain, no fatigue, no polydipsia and no polyuria. Symptoms are stable. Current diabetic treatment includes oral agent (monotherapy). He is compliant with treatment all of the time. His weight is stable. His breakfast blood glucose is taken between 7-8 am. His breakfast blood glucose range is generally 110-130 mg/dl.  Thyroid Problem Presents for follow-up visit. Patient reports no fatigue or palpitations. The symptoms have been stable. Past treatments include levothyroxine.  Warfarin therapy - 3.75 mg Wed and Sun, 7.5 mg all other days. He denies bleeding or excessive bruising issues. His peripheral vascular disease has been stable with no recent interventions required. Smoking - he is interested in quitting completely.  He is down to 4 cigarettes per day but cant quit altogether. He continues to cough up clear phlegm on a daily basis since his pulmonary infection last fall. Review of the chest x-ray showed only COPD with no infiltrate to suggest pneumonia. He has not been using his steroid inhaler on a regular basis - I encouraged him to do so to help reduce mucus production.  Lab Results  Component Value Date   HGBA1C 7.6* 07/22/2015    Review of Systems  Constitutional: Negative for fever, chills, fatigue and unexpected weight change.  HENT: Negative for hearing loss, sinus pressure and sore throat.   Eyes: Negative for visual disturbance.  Respiratory: Positive for cough and shortness of breath. Negative for chest tightness and stridor.   Cardiovascular: Negative for chest pain and palpitations.  Gastrointestinal: Negative for abdominal pain.  Endocrine: Negative for polydipsia and polyuria.    Musculoskeletal: Positive for arthralgias (pain in right foot).  Psychiatric/Behavioral: Negative for sleep disturbance.    Patient Active Problem List   Diagnosis Date Noted  . Acquired hypothyroidism 04/25/2015  . 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 History of anticoagulant therapy 04/25/2015  . 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  . DM (diabetes mellitus), 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/2015  . 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  . Disorder of subcutaneous tissue 04/25/2015  . Angiopathy, 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  . Cigarette smoker 04/25/2015  . 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    Prior to Admission medications   Medication Sig Start Date End Date Taking? Authorizing Provider  albuterol (VENTOLIN HFA) 108 (90 BASE) MCG/ACT inhaler Inhale 2 puffs into the lungs 4 (four) times daily as needed. 02/05/11   Historical Provider, MD  cefdinir (OMNICEF) 300 MG capsule Take 1 capsule (300 mg total) by mouth 2 (two) times daily. 10/26/15   [name], MD  glucose blood (ONE TOUCH ULTRA TEST) test strip  06/01/13   Historical Provider, MD  levothyroxine (SYNTHROID, LEVOTHROID) 112 MCG tablet TAKE ONE TABLET BY MOUTH ONCE DAILY 08/22/15   [name], MD  metFORMIN (GLUCOPHAGE) 500 MG tablet TAKE ONE TABLET BY MOUTH TWICE DAILY 07/24/15   [name], MD  methylPREDNISolone (MEDROL DOSEPAK) 4 MG TBPK tablet Take 6 pills on day 1 the 5 pills day 2 then 4 pills day 3 then 3 pills day 4 then 2 pills day 5 then  one pills day 6 then stop 10/26/15   [name], MD  Mometasone Furoate [facility] HFA) 100 MCG/ACT AERO Inhale 200 mcg into the lungs 2 (two) times daily. 10/20/15   [name], MD  warfarin (COUMADIN) 7.5 MG tablet Take 1 tablet by mouth daily.  3.75 mg Wed and Sun; 7.5 mg other days 03/27/15   Historical Provider, MD    Allergies  Allergen Reactions  . Iodinated Diagnostic Agents Hives  . Penicillins Hives  . Shellfish-Derived Products     Past Surgical History  Procedure Laterality Date  . Leg amputation below knee Left 2014  . Femoral-popliteal bypass graft Left 1996  . Carotid-subclavian bypass graft Left 2001  . Abdominal aortic endovascular stent graft  1996  . Appendectomy    . Coronary angioplasty with stent placement  1992, 2004    Social History  Substance Use Topics  . Smoking status: Current Every Day Smoker -- 0.10 packs/day    Types: Cigarettes  . Smokeless tobacco: None  . Alcohol Use: No     Medication list has been reviewed and updated.   Physical Exam  Constitutional: He is oriented to person, place, and time. He appears well-developed. No distress.  HENT:  Head: Normocephalic and atraumatic.  Cardiovascular: Normal rate, regular rhythm and normal heart sounds.   Pulmonary/Chest: Effort normal. No respiratory distress. He has decreased breath sounds. He has no wheezes. He has no rhonchi.  Musculoskeletal: Normal range of motion.  Neurological: He is alert and oriented to person, place, and time.  Skin: Skin is warm and dry. No rash noted.  Psychiatric: He has a normal mood and affect. His speech is normal and behavior is normal. Thought content normal.  Nursing note and vitals reviewed.   BP 138/56 mmHg  Pulse 64  Ht  (1.727 m)  Wt 131 lb 9.6 oz (59.693 kg)  BMI 20.01 kg/m2  Assessment and Plan: 1. DM (diabetes mellitus), type 2 with peripheral vascular complications (HCC) Continue oral agents and home blood sugar monitoring - Hemoglobin A1c - Comprehensive metabolic panel  2. Acquired hypothyroidism Supplemented - TSH  3. Angiopathy, peripheral (HCC) Follow-up with [facility] vascular yearly Continue warfarin with a goal INR of 3-3.5 - Protime-INR  4. History of anticoagulant  therapy - Protime-INR  5. 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 Cigarette smoker Will try Wellbutrin to help him quit completely - buPROPion (WELLBUTRIN SR) 150 MG 12 hr tablet; Take 1 tablet (150 mg total) by mouth 2 (two) times daily.  Dispense: 60 tablet; Refill: 5  Partially dictated using Animal nutritionist. Any errors are unintentional.  Bari Edward, MD [facility]  12/20/2015

## 2015-12-21 ENCOUNTER — Telehealth: Payer: Self-pay

## 2015-12-21 ENCOUNTER — Other Ambulatory Visit: Payer: Self-pay | Admitting: Internal Medicine

## 2015-12-21 LAB — COMPREHENSIVE METABOLIC PANEL
A/G RATIO: 1.3 (ref 1.1–2.5)
ALBUMIN: 4.3 g/dL (ref 3.5–4.8)
ALT: 9 IU/L (ref 0–44)
AST: 20 IU/L (ref 0–40)
Alkaline Phosphatase: 96 IU/L (ref 39–117)
BUN / CREAT RATIO: 12 (ref 10–22)
BUN: 11 mg/dL (ref 8–27)
CHLORIDE: 97 mmol/L (ref 96–106)
CO2: 20 mmol/L (ref 18–29)
Calcium: 9.5 mg/dL (ref 8.6–10.2)
Creatinine, Ser: 0.89 mg/dL (ref 0.76–1.27)
GFR calc non Af Amer: 85 mL/min/{1.73_m2} (ref >59)
GFR, EST AFRICAN AMERICAN: 99 mL/min/{1.73_m2} (ref >59)
GLUCOSE: 165 mg/dL — AB (ref 65–99)
Globulin, Total: 3.3 g/dL (ref 1.5–4.5)
Potassium: 5.3 mmol/L — ABNORMAL HIGH (ref 3.5–5.2)
Sodium: 139 mmol/L (ref 134–144)
TOTAL PROTEIN: 7.6 g/dL (ref 6.0–8.5)

## 2015-12-21 LAB — PROTIME-INR
INR: 4.3 — ABNORMAL HIGH (ref 0.8–1.2)
Prothrombin Time: 43.2 s — ABNORMAL HIGH (ref 9.1–12.0)

## 2015-12-21 LAB — HEMOGLOBIN A1C
ESTIMATED AVERAGE GLUCOSE: 183 mg/dL
HEMOGLOBIN A1C: 8 % — AB (ref 4.8–5.6)

## 2015-12-21 LAB — TSH: TSH: 0.2 u[IU]/mL — ABNORMAL LOW (ref 0.450–4.500)

## 2015-12-21 MED ORDER — LEVOTHYROXINE SODIUM 100 MCG PO TABS: 112.0000 ug | ORAL_TABLET | Freq: Every day | ORAL | Status: DC

## 2015-12-21 NOTE — Telephone Encounter (Signed)
-----   Message from [name], MD sent at 12/21/2015  8:03 AM EST ----- DM is worse - A1C is 8.  Need to add a third metformin in the evening.  Thyroid may be over-supplemented (should reduce the dose of levothyroxine to 100 mcg).  INR is high - just hold one dose then resume same schedule.  Kidney and liver functions are normal.

## 2015-12-21 NOTE — Telephone Encounter (Signed)
Left message for patient to call back  

## 2015-12-22 ENCOUNTER — Other Ambulatory Visit: Payer: Self-pay

## 2015-12-22 NOTE — Telephone Encounter (Signed)
Spoke with patient. Patient advised of all results and verbalized understanding. Will call back with any future questions or concerns. MAH  

## 2015-12-27 ENCOUNTER — Other Ambulatory Visit: Payer: Self-pay | Admitting: Internal Medicine

## 2015-12-29 ENCOUNTER — Other Ambulatory Visit: Payer: Self-pay | Admitting: Internal Medicine

## 2015-12-30 ENCOUNTER — Other Ambulatory Visit: Payer: Self-pay | Admitting: Internal Medicine

## 2015-12-30 MED ORDER — METFORMIN HCL 500 MG PO TABS: 500.0000 mg | ORAL_TABLET | Freq: Three times a day (TID) | ORAL | Status: DC

## 2016-04-30 ENCOUNTER — Encounter: Payer: Self-pay | Admitting: Internal Medicine

## 2016-04-30 NOTE — Progress Notes (Signed)
Message sent to patient through the portal.

## 2016-05-11 ENCOUNTER — Other Ambulatory Visit: Payer: Self-pay | Admitting: Internal Medicine

## 2016-05-11 MED ORDER — WARFARIN SODIUM 7.5 MG PO TABS: 7.5000 mg | ORAL_TABLET | Freq: Every day | ORAL | Status: DC

## 2016-08-15 ENCOUNTER — Other Ambulatory Visit: Payer: Self-pay | Admitting: Internal Medicine

## 2016-08-15 DIAGNOSIS — F17210 Nicotine dependence, cigarettes, uncomplicated: Secondary | ICD-10-CM

## 2016-09-13 ENCOUNTER — Other Ambulatory Visit: Payer: Self-pay | Admitting: Internal Medicine

## 2016-09-13 DIAGNOSIS — F17210 Nicotine dependence, cigarettes, uncomplicated: Secondary | ICD-10-CM

## 2016-09-14 ENCOUNTER — Telehealth: Payer: Self-pay | Admitting: Internal Medicine

## 2016-09-14 NOTE — Telephone Encounter (Signed)
-----   Message from Reubin MilanLaura H Berglund, MD sent at 09/14/2016 11:45 AM EDT ----- Please find out if Ruben Wright has changed PCPs.  He has not been in for an INR check in months.

## 2016-09-14 NOTE — Telephone Encounter (Signed)
I have contacted the patient and he stated he is no longer coming to this practice. I have "ended" you as being his PCP

## 2016-10-16 ENCOUNTER — Other Ambulatory Visit: Payer: Self-pay | Admitting: Internal Medicine

## 2016-10-16 DIAGNOSIS — F17210 Nicotine dependence, cigarettes, uncomplicated: Secondary | ICD-10-CM

## 2016-10-22 ENCOUNTER — Other Ambulatory Visit: Payer: Self-pay | Admitting: Internal Medicine

## 2016-10-22 DIAGNOSIS — F17210 Nicotine dependence, cigarettes, uncomplicated: Secondary | ICD-10-CM

## 2016-10-29 ENCOUNTER — Other Ambulatory Visit: Payer: Self-pay | Admitting: Internal Medicine

## 2016-10-29 DIAGNOSIS — F17210 Nicotine dependence, cigarettes, uncomplicated: Secondary | ICD-10-CM

## 2017-07-16 ENCOUNTER — Inpatient Hospital Stay: Payer: Medicare Other | Admitting: Internal Medicine

## 2017-07-16 NOTE — Progress Notes (Deleted)
[facility] CONSULT NOTE  Patient Care Team: Ruben Mould, MD as PCP - General (Family Medicine)  CHIEF COMPLAINTS/PURPOSE OF CONSULTATION:  ***  #   No history exists.     HISTORY OF PRESENTING ILLNESS:  Ruben Wright Sr. 76 y.o.  male     ROS: A complete 10 point review of system is done which is negative except mentioned above in history of present illness  MEDICAL HISTORY:  No past medical history on file.  SURGICAL HISTORY: Past Surgical History:  Procedure Laterality Date  . ABDOMINAL AORTIC ENDOVASCULAR STENT GRAFT  1996  . APPENDECTOMY    . CAROTID-SUBCLAVIAN BYPASS GRAFT Left 2001  . CORONARY ANGIOPLASTY WITH STENT PLACEMENT  1992, 2004  . FEMORAL-POPLITEAL BYPASS GRAFT Left 1996  . LEG AMPUTATION BELOW KNEE Left 2014    SOCIAL HISTORY: Social History   Social History  . Marital status: Married    Spouse name: N/A  . Number of children: N/A  . Years of education: N/A   Occupational History  . Not on file.   Social History Main Topics  . Smoking status: Current Every Day Smoker    Packs/day: 0.10    Types: Cigarettes  . Smokeless tobacco: Not on file  . Alcohol use No  . Drug use: No  . Sexual activity: Not on file   Other Topics Concern  . Not on file   Social History Narrative  . No narrative on file    FAMILY HISTORY: Family History  Problem Relation Age of Onset  . CAD Mother   . Hypothyroidism Mother     ALLERGIES:  is allergic to iodinated diagnostic agents; penicillins; and shellfish-derived products.  MEDICATIONS:  Current Outpatient Prescriptions  Medication Sig Dispense Refill  . albuterol (VENTOLIN HFA) 108 (90 BASE) MCG/ACT inhaler Inhale 2 puffs into the lungs 4 (four) times daily as needed.    Marland Kitchen buPROPion (WELLBUTRIN SR) 150 MG 12 hr tablet TAKE ONE TABLET BY MOUTH TWICE DAILY 60 tablet 0  . glucose blood (ONE TOUCH ULTRA TEST) test strip     . levothyroxine (SYNTHROID, LEVOTHROID) 112 MCG tablet  Take 1 tablet by mouth daily.    . metFORMIN (GLUCOPHAGE) 500 MG tablet Take 1 tablet (500 mg total) by mouth 3 (three) times daily. Take one in the AM and 2 in the PM 270 tablet 3  . warfarin (COUMADIN) 7.5 MG tablet Take 1 tablet (7.5 mg total) by mouth daily. 3.75 mg Wed and Sun; 7.5 mg other days 90 tablet 3   Current Facility-Administered Medications  Medication Dose Route Frequency Provider Last Rate Last Dose  . cyanocobalamin ((VITAMIN B-12)) injection 1,000 mcg  1,000 mcg Intramuscular Q30 days [name], MD   1,000 mcg at 12/20/15 1422      .  PHYSICAL EXAMINATION: ECOG PERFORMANCE STATUS: {CHL ONC ECOG [phone]}  There were no vitals filed for this visit. There were no vitals filed for this visit.  GENERAL: Well-nourished well-developed; Alert, no distress and comfortable.   *** EYES: no pallor or icterus OROPHARYNX: no thrush or ulceration; good dentition  NECK: supple, no masses felt LYMPH:  no palpable lymphadenopathy in the cervical, axillary or inguinal regions LUNGS: clear to auscultation and  No wheeze or crackles HEART/CVS: regular rate & rhythm and no murmurs; No lower extremity edema ABDOMEN: abdomen soft, non-tender and normal bowel sounds Musculoskeletal:no cyanosis of digits and no clubbing  PSYCH: alert & oriented x 3 with fluent speech NEURO:  no focal motor/sensory deficits SKIN:  no rashes or significant lesions  LABORATORY DATA:  I have reviewed the data as listed No results found for: WBC, HGB, HCT, MCV, PLT No results for input(s): NA, K, CL, CO2, GLUCOSE, BUN, CREATININE, CALCIUM, GFRNONAA, GFRAA, PROT, ALBUMIN, AST, ALT, ALKPHOS, BILITOT, BILIDIR, IBILI in the last 8760 hours.  RADIOGRAPHIC STUDIES: I have personally reviewed the radiological images as listed and agreed with the findings in the report. No results found.  ASSESSMENT & PLAN:   No problem-specific Assessment & Plan notes found for this encounter.    All questions  were answered. The patient knows to call the clinic with any problems, questions or concerns.       Earna CoderGovinda R Brahmanday, MD 07/16/2017 7:54 AM

## 2017-07-24 ENCOUNTER — Inpatient Hospital Stay: Payer: Medicare Other | Attending: Internal Medicine | Admitting: Hematology and Oncology

## 2017-07-24 ENCOUNTER — Telehealth: Payer: Self-pay | Admitting: Urgent Care

## 2017-07-24 ENCOUNTER — Inpatient Hospital Stay: Payer: Medicare Other

## 2017-07-24 ENCOUNTER — Encounter: Payer: Self-pay | Admitting: Hematology and Oncology

## 2017-07-24 VITALS — BP 126/69 | HR 84 | Temp 95.5°F | Resp 18 | Ht 68.0 in | Wt 140.0 lb

## 2017-07-24 DIAGNOSIS — Z7901 Long term (current) use of anticoagulants: Secondary | ICD-10-CM | POA: Diagnosis not present

## 2017-07-24 DIAGNOSIS — E7211 Homocystinuria: Secondary | ICD-10-CM | POA: Insufficient documentation

## 2017-07-24 DIAGNOSIS — Z87891 Personal history of nicotine dependence: Secondary | ICD-10-CM | POA: Diagnosis not present

## 2017-07-24 DIAGNOSIS — D6851 Activated protein C resistance: Secondary | ICD-10-CM | POA: Diagnosis not present

## 2017-07-24 DIAGNOSIS — E1151 Type 2 diabetes mellitus with diabetic peripheral angiopathy without gangrene: Secondary | ICD-10-CM | POA: Diagnosis not present

## 2017-07-24 DIAGNOSIS — Z7984 Long term (current) use of oral hypoglycemic drugs: Secondary | ICD-10-CM | POA: Insufficient documentation

## 2017-07-24 DIAGNOSIS — J449 Chronic obstructive pulmonary disease, unspecified: Secondary | ICD-10-CM | POA: Insufficient documentation

## 2017-07-24 DIAGNOSIS — Z955 Presence of coronary angioplasty implant and graft: Secondary | ICD-10-CM | POA: Diagnosis not present

## 2017-07-24 DIAGNOSIS — Z9229 Personal history of other drug therapy: Secondary | ICD-10-CM

## 2017-07-24 DIAGNOSIS — Z89512 Acquired absence of left leg below knee: Secondary | ICD-10-CM | POA: Insufficient documentation

## 2017-07-24 LAB — COMPREHENSIVE METABOLIC PANEL
ALT: 17 U/L (ref 17–63)
AST: 30 U/L (ref 15–41)
Albumin: 3.9 g/dL (ref 3.5–5.0)
Alkaline Phosphatase: 65 U/L (ref 38–126)
Anion gap: 8 (ref 5–15)
BUN: <5 mg/dL — ABNORMAL LOW (ref 6–20)
CO2: 25 mmol/L (ref 22–32)
Calcium: 9.3 mg/dL (ref 8.9–10.3)
Chloride: 102 mmol/L (ref 101–111)
Creatinine, Ser: 1.05 mg/dL (ref 0.61–1.24)
GFR calc Af Amer: >60 mL/min (ref >60)
GFR calc non Af Amer: >60 mL/min (ref >60)
Glucose, Bld: 187 mg/dL — ABNORMAL HIGH (ref 65–99)
Potassium: 4.2 mmol/L (ref 3.5–5.1)
Sodium: 135 mmol/L (ref 135–145)
Total Bilirubin: 0.6 mg/dL (ref 0.3–1.2)
Total Protein: 7.6 g/dL (ref 6.5–8.1)

## 2017-07-24 LAB — CBC WITH DIFFERENTIAL/PLATELET
BASOS PCT: 2 %
Basophils Absolute: 0.1 10*3/uL (ref 0–0.1)
Eosinophils Absolute: 0.5 10*3/uL (ref 0–0.7)
Eosinophils Relative: 8 %
HEMATOCRIT: 40.6 % (ref 40.0–52.0)
HEMOGLOBIN: 13.1 g/dL (ref 13.0–18.0)
LYMPHS ABS: 1.3 10*3/uL (ref 1.0–3.6)
LYMPHS PCT: 22 %
MCH: 27.1 pg (ref 26.0–34.0)
MCHC: 32.3 g/dL (ref 32.0–36.0)
MCV: 83.9 fL (ref 80.0–100.0)
MONO ABS: 0.6 10*3/uL (ref 0.2–1.0)
MONOS PCT: 9 %
NEUTROS ABS: 3.8 10*3/uL (ref 1.4–6.5)
NEUTROS PCT: 59 %
Platelets: 209 10*3/uL (ref 150–440)
RBC: 4.84 MIL/uL (ref 4.40–5.90)
RDW: 18.1 % — ABNORMAL HIGH (ref 11.5–14.5)
WBC: 6.3 10*3/uL (ref 3.8–10.6)

## 2017-07-24 LAB — PROTIME-INR
INR: 1.79
Prothrombin Time: 20.6 seconds — ABNORMAL HIGH (ref 11.4–15.2)

## 2017-07-24 NOTE — Telephone Encounter (Signed)
Called to confirm that they received the INR result that was sent over earlier today; spoke with Tosha. She was unaware. INR of 1.79 communicated verbally; confirmed via read back. Per Mickie Bailosha, she will communicate this result to Dr. Ether GriffinsFowler and Coumadin will be adjusted by their office. If Dr. Ether GriffinsFowler has questions, she will return a call to the hematology clinic.

## 2017-07-24 NOTE — Progress Notes (Signed)
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  He is on coumadin 5 mg 6 days a week and 7.5 mg one day a week.  Referred by Dr. Noland FordyceFowleer @ [facility].  Offers no complaints today.

## 2017-07-24 NOTE — Progress Notes (Signed)
[facility]-  Cancer Center  Clinic day:  07/24/2017  Chief Complaint: Ruben T Schreiter Sr. 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 Vickie Fowler for assessment and management.  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 He is on daily vitamin B12, B6, and folic acid supplementation. First cousin died at the age of 40 from a pulmonary embolism related to the homocystinuria. He has another living cousin with it.  He has had a femoral angioplasty with 2 stents placed in 1992 and a aortofemoral  bypass in 1996.  He had a left carotid to subclavian bypass graft in 2001.  He developed a LEFT lower extremity clot 2 years ago, and despite being anticoagulated, he developed a DVT.  He was told that he needed to stay on Coumadin only.  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  He denies any history of pulmonary embolism, myocardial infarction or cerebral vascular accident.  He comments that he was on the same dosage of Coumadin x 20 years (5 mg/day).  For the last 23 months, his INR has been difficult to regulate.  He avoids vitamin K rich foods.  He was recently on steroids and antibiotics (Ceftin) for an upper respiratory tract infection.  He took antibiotics for 7-8 days.  He has had INR checked x 3 in the last week.  INR has ranged from 1.4 to 6.  He denies diarrhea.  He bruises easily.  He denies fevers, sweats, and significant recent weight loss.  He denies any rectal bleeding.  Last colonoscopy was performed about 4 years ago.    Past Medical History:  Diagnosis Date  . Allergy   . Anemia   . Cataract   . Clotting disorder (HCC)   . COPD (chronic obstructive pulmonary disease) (HCC)   . Diabetes mellitus without complication (HCC)     Past Surgical History:  Procedure Laterality Date  . ABDOMINAL AORTIC  ENDOVASCULAR STENT GRAFT  1996  . APPENDECTOMY    . CAROTID-SUBCLAVIAN BYPASS GRAFT Left 2001  . COLONOSCOPY    . CORONARY ANGIOPLASTY WITH STENT PLACEMENT  1992, 2004  . FEMORAL-POPLITEAL BYPASS GRAFT Left 1996  . LEG AMPUTATION BELOW KNEE Left 2014    Family History  Problem Relation Age of Onset  . CAD Mother   . Hypothyroidism Mother   . Cancer Father     Social History:  reports that he quit smoking about 20 months ago. His smoking use included Cigarettes. He has a 50.00 pack-year smoking history. He does not have any smokeless tobacco history on file. He reports that he does not drink alcohol or use drugs.   He is a former smoker for "years".  He stopped smoking in 11/2015.  He has two children; one son and one daughter. His son was born with "hypertension"; dye studies were performed that caused multiple blood clots. The patient is alone today.  Allergies:  Allergies  Allergen Reactions  . Iodinated Diagnostic Agents Hives  . Penicillins Hives  . Shellfish-Derived Products     Current Medications: Current Outpatient Prescriptions  Medication Sig Dispense Refill  . albuterol (PROVENTIL HFA;VENTOLIN HFA) 108 (90 Base) MCG/ACT inhaler Inhale 1 puff into the lungs daily.    . albuterol (VENTOLIN HFA) 108 (90 BASE) MCG/ACT inhaler Inhale 2 puffs into the lungs 4 (four) times daily as needed.    .   budesonide-formoterol (SYMBICORT) 160-4.5 MCG/ACT inhaler Inhale 2 puffs into the lungs 2 (two) times daily.    . cefUROXime (CEFTIN) 500 MG tablet Take 500 mg by mouth 2 (two) times daily.    . ferrous gluconate (FERGON) 324 MG tablet Take 324 mg by mouth daily.    . glucose blood (ONE TOUCH ULTRA TEST) test strip     . ipratropium (ATROVENT HFA) 17 MCG/ACT inhaler Inhale 2 puffs into the lungs QID.    . levocetirizine (XYZAL) 5 MG tablet Take 5 mg by mouth daily.    . levothyroxine (SYNTHROID, LEVOTHROID) 112 MCG tablet Take 1 tablet by mouth daily.    . metFORMIN (GLUCOPHAGE) 500  MG tablet Take 1 tablet (500 mg total) by mouth 3 (three) times daily. Take one in the AM and 2 in the PM 270 tablet 3  . rosuvastatin (CRESTOR) 10 MG tablet Take 10 mg by mouth daily.    . warfarin (COUMADIN) 5 MG tablet Take 5 mg by mouth. Currently taking 5 mg 6 days a week and 7.5 one day a week.    . warfarin (COUMADIN) 7.5 MG tablet Take 1 tablet (7.5 mg total) by mouth daily. 3.75 mg Wed and Sun; 7.5 mg other days 90 tablet 3   Current Facility-Administered Medications  Medication Dose Route Frequency Provider Last Rate Last Dose  . cyanocobalamin ((VITAMIN B-12)) injection 1,000 mcg  1,000 mcg Intramuscular Q30 days Berglund, Laura H, MD   1,000 mcg at 12/20/15 1422    Review of Systems:  GENERAL:  Feels "ok".  No fevers or sweats.  Weight loss associated with amputation then gain of 15 pounds. PERFORMANCE STATUS (ECOG):  2 HEENT:  Cataracts (planned surgery). No visual changes, runny nose, sore throat, mouth sores or tenderness. Lungs: No shortness of breath or cough.  No hemoptysis. Cardiac:  No chest pain, palpitations, orthopnea, or PND. GI:  No nausea, vomiting, diarrhea, constipation, melena or hematochezia. GU:  No urgency, frequency, dysuria, or hematuria. Musculoskeletal:  No back pain.  No joint pain.  No muscle tenderness. Extremities:  LEFT below the knee amputation. No pain or swelling. Skin:  No rashes or skin changes. Neuro:  No headache, numbness or weakness, balance or coordination issues. Endocrine:  Diabetes.  Thyroid disease on Synthroid.  No hot flashes or night sweats. Psych:  No mood changes, depression or anxiety. Pain:  No focal pain. Review of systems:  All other systems reviewed and found to be negative.  Physical Exam: Blood pressure 126/69, pulse 84, temperature (!) 95.5 F (35.3 C), temperature source Tympanic, resp. rate 18, height 5' 8" (1.727 m), weight 139 lb 15.9 oz (63.5 kg). GENERAL:  Well developed, well nourished, gentleman sitting  comfortably in the exam room in no acute distress. MENTAL STATUS:  Alert and oriented to person, place and time. HEAD:  Thin grey hair.  Normocephalic, atraumatic, face symmetric, no Cushingoid features. EYES:  Pupils equal round and reactive to light and accomodation.  No conjunctivitis or scleral icterus. ENT:  Oropharynx clear without lesion.  Tongue normal. Mucous membranes moist.  RESPIRATORY:  Clear to auscultation without rales, wheezes or rhonchi. CARDIOVASCULAR:  Regular rate and rhythm without murmur, rub or gallop. ABDOMEN:  Soft, non-tender, with active bowel sounds, and no hepatosplenomegaly.  No masses. SKIN:  No rashes, ulcers or lesions. EXTREMITIES: LEFT below the knee amputation. No edema, no skin discoloration or tenderness.  No palpable cords. LYMPH NODES: No palpable cervical, supraclavicular, axillary or inguinal adenopathy  NEUROLOGICAL: Unremarkable.   PSYCH:  Appropriate.   Office Visit on 07/24/2017  Component Date Value Ref Range Status  . Prothrombin Time 07/24/2017 20.6* 11.4 - 15.2 seconds Final  . INR 07/24/2017 1.79   Final  . WBC 07/24/2017 6.3  3.8 - 10.6 K/uL Final  . RBC 07/24/2017 4.84  4.40 - 5.90 MIL/uL Final  . Hemoglobin 07/24/2017 13.1  13.0 - 18.0 g/dL Final  . HCT 07/24/2017 40.6  40.0 - 52.0 % Final  . MCV 07/24/2017 83.9  80.0 - 100.0 fL Final  . MCH 07/24/2017 27.1  26.0 - 34.0 pg Final  . MCHC 07/24/2017 32.3  32.0 - 36.0 g/dL Final  . RDW 07/24/2017 18.1* 11.5 - 14.5 % Final  . Platelets 07/24/2017 209  150 - 440 K/uL Final  . Neutrophils Relative % 07/24/2017 59  % Final  . Neutro Abs 07/24/2017 3.8  1.4 - 6.5 K/uL Final  . Lymphocytes Relative 07/24/2017 22  % Final  . Lymphs Abs 07/24/2017 1.3  1.0 - 3.6 K/uL Final  . Monocytes Relative 07/24/2017 9  % Final  . Monocytes Absolute 07/24/2017 0.6  0.2 - 1.0 K/uL Final  . Eosinophils Relative 07/24/2017 8  % Final  . Eosinophils Absolute 07/24/2017 0.5  0 - 0.7 K/uL Final  . Basophils  Relative 07/24/2017 2  % Final  . Basophils Absolute 07/24/2017 0.1  0 - 0.1 K/uL Final  . Sodium 07/24/2017 135  135 - 145 mmol/L Final  . Potassium 07/24/2017 4.2  3.5 - 5.1 mmol/L Final  . Chloride 07/24/2017 102  101 - 111 mmol/L Final  . CO2 07/24/2017 25  22 - 32 mmol/L Final  . Glucose, Bld 07/24/2017 187* 65 - 99 mg/dL Final  . BUN 07/24/2017 <5* 6 - 20 mg/dL Final  . Creatinine, Ser 07/24/2017 1.05  0.61 - 1.24 mg/dL Final  . Calcium 07/24/2017 9.3  8.9 - 10.3 mg/dL Final  . Total Protein 07/24/2017 7.6  6.5 - 8.1 g/dL Final  . Albumin 07/24/2017 3.9  3.5 - 5.0 g/dL Final  . AST 07/24/2017 30  15 - 41 U/L Final  . ALT 07/24/2017 17  17 - 63 U/L Final  . Alkaline Phosphatase 07/24/2017 65  38 - 126 U/L Final  . Total Bilirubin 07/24/2017 0.6  0.3 - 1.2 mg/dL Final  . GFR calc non Af Amer 07/24/2017 >60  >60 mL/min Final  . GFR calc Af Amer 07/24/2017 >60  >60 mL/min Final   Comment: (NOTE) The eGFR has been calculated using the CKD EPI equation. This calculation has not been validated in all clinical situations. eGFR's persistently <60 mL/min signify possible Chronic Kidney Disease.   . Anion gap 07/24/2017 8  5 - 15 Final    Assessment:  Cristhian T Ching Sr. is a 76 y.o. 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   He has had a femoral angioplasty with 2 stents placed in 1992 and a aortofemoral  bypass in 1996.  He had a left carotid to subclavian bypass graft in 2001.  He is on chronic Coumadin.  He is on daily vitamin B12, B6, and folic acid supplementation.  INR has been difficult to maintain in the past 3 months.  He avoids vitamin K rich foods.  He denies diarrhea.  He has been on antibiotics.  Symptomatically, he denies any concerning symptoms.  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  Plan: 1.  Labs today: CBC with diff, CMP, homocysteine, factor V Leiden, PT/INR. 2.    Discuss anticoagulation options. Will discuss ongoing  therapy using an anti-Xa agent versus warfarin with Dr. Marston.  Remain on current Coumadin dose until further advised.  3.  Patient has scheduled PT/INR check scheduled for 07/25/2017. He is asking for today's result to be sent so that he does not have to go. INR 1.79. Result routed to Dr. Fowler's office.  4.  RTC in 1 week for MD assessment, review of labs and discussion regarding direction of therapy.   Bryan Gray, NP  07/24/2017, 1:20 PM   I saw and evaluated the patient, participating in the key portions of the service and reviewing pertinent diagnostic studies and records.  I reviewed the nurse practitioner's note and agree with the findings and the plan.  The assessment and plan were discussed with the patient.  Multiple questions were asked by the patient and answered.   Melissa C Corcoran, MD 07/24/2017 

## 2017-07-25 LAB — HOMOCYSTEINE: HOMOCYSTEINE-NORM: 15.6 umol/L — AB (ref 0.0–15.0)

## 2017-07-29 ENCOUNTER — Encounter: Payer: Self-pay | Admitting: Hematology and Oncology

## 2017-07-29 LAB — FACTOR 5 LEIDEN

## 2017-07-30 NOTE — Progress Notes (Signed)
[facility]-  Cancer Center  Clinic day:  07/31/2017  Chief Complaint: Ruben MCPHEARSON Sr. 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   HPI:  Patient was last seen in the hematology/oncology clinic on 07/24/2017 for initial consultation. He was referred due to difficulties managing patient's anticoagulation therapy.  INR had ranged from 1.4 - 6.0. He has homocystinuria and has been on Coumadin since 1996. He is on a daily regimen that includes vitamin B12, vitamin B6, folic acid.   CBC and chemistries on 07/24/2017 were normal.  Factor V Leiden was negative. Homocysteine level was elevated at 15.6 (0-15). INR was subtherapeutic at 1.79.  He was taking 5 mg of Coumadin daily. Of note, he was finishing a round of steroids and Ceftin for a upper respiratory tract infection. INR result was sent to patient's primary care physician, who increased patient's Coumadin dose to 10 mg 1 dose, then 2.5 mg x 1 then 5 mg a day.  Symptomatically, he denies any new complaints.  He denies any bruising or bleeding.   Past Medical History:  Diagnosis Date  . Allergy   . Anemia   . Cataract   . Clotting disorder (HCC)   . COPD (chronic obstructive pulmonary disease) (HCC)   . Diabetes mellitus without complication [facility])     Past Surgical History:  Procedure Laterality Date  . ABDOMINAL AORTIC ENDOVASCULAR STENT GRAFT  1996  . APPENDECTOMY    . CAROTID-SUBCLAVIAN BYPASS GRAFT Left 2001  . COLONOSCOPY    . CORONARY ANGIOPLASTY WITH STENT PLACEMENT  1992, 2004  . FEMORAL-POPLITEAL BYPASS GRAFT Left 1996  . LEG AMPUTATION BELOW KNEE Left 2014    Family History  Problem Relation Age of Onset  . CAD Mother   . Hypothyroidism Mother   . Cancer Father     Social History:  reports that he quit smoking about 20 months ago. His smoking use included Cigarettes. He has a 50.00 pack-year smoking history. He has never  used smokeless tobacco. He reports that he does not drink alcohol or use drugs.   He is a former smoker for "years".  He stopped smoking in 11/2015.  He has two children; one son and one daughter. His son was born with "hypertension"; dye studies were performed that caused multiple blood clots. The patient is alone today.  Allergies:  Allergies  Allergen Reactions  . Iodinated Diagnostic Agents Hives  . Penicillins Hives  . Shellfish-Derived Products     Current Medications: Current Outpatient Prescriptions  Medication Sig Dispense Refill  . ferrous gluconate (FERGON) 324 MG tablet Take 324 mg by mouth daily.    Marland Kitchen levocetirizine (XYZAL) 5 MG tablet Take 5 mg by mouth daily.    Marland Kitchen levothyroxine (SYNTHROID, LEVOTHROID) 112 MCG tablet Take 1 tablet by mouth daily.    . metFORMIN (GLUCOPHAGE) 500 MG tablet Take 500 mg by mouth 2 (two) times daily with a meal.    . rosuvastatin (CRESTOR) 10 MG tablet Take 10 mg by mouth daily.    Marland Kitchen warfarin (COUMADIN) 5 MG tablet Take 5 mg by mouth. Currently taking 5 mg 6 days a week and 7.5 one day a week.    . warfarin (COUMADIN) 7.5 MG tablet Take 1 tablet (7.5 mg total) by mouth daily. 3.75 mg Wed and Sun; 7.5 mg other days 90 tablet 3  . albuterol (PROVENTIL HFA;VENTOLIN HFA) 108 (90 Base) MCG/ACT inhaler Inhale  1 puff into the lungs daily.    Marland Kitchen albuterol (VENTOLIN HFA) 108 (90 BASE) MCG/ACT inhaler Inhale 2 puffs into the lungs 4 (four) times daily as needed.    . budesonide-formoterol (SYMBICORT) 160-4.5 MCG/ACT inhaler Inhale 2 puffs into the lungs 2 (two) times daily.    Marland Kitchen glucose blood (ONE TOUCH ULTRA TEST) test strip     . ipratropium (ATROVENT HFA) 17 MCG/ACT inhaler Inhale 2 puffs into the lungs QID.     Current Facility-Administered Medications  Medication Dose Route Frequency Provider Last Rate Last Dose  . cyanocobalamin ((VITAMIN B-12)) injection 1,000 mcg  1,000 mcg Intramuscular Q30 days [name], MD   1,000 mcg at 12/20/15 1422     Review of Systems:  GENERAL:  Feels "ok".  No fevers or sweats.  Weight stable. PERFORMANCE STATUS (ECOG):  2 HEENT:  Cataracts (planned surgery). No visual changes, runny nose, sore throat, mouth sores or tenderness. Lungs: No shortness of breath or cough.  No hemoptysis. Cardiac:  No chest pain, palpitations, orthopnea, or PND. GI:  No nausea, vomiting, diarrhea, constipation, melena or hematochezia. GU:  No urgency, frequency, dysuria, or hematuria. Musculoskeletal:  No back pain.  No joint pain.  No muscle tenderness. Extremities:  LEFT below the knee amputation. No pain or swelling. Skin:  No rashes or skin changes. Neuro:  No headache, numbness or weakness, balance or coordination issues. Endocrine:  Diabetes.  Thyroid disease on Synthroid.  No hot flashes or night sweats. Psych:  No mood changes, depression or anxiety. Pain:  No focal pain. Review of systems:  All other systems reviewed and found to be negative.  Physical Exam: Blood pressure (!) 144/73, pulse 84, temperature (!) 95.5 F (35.3 C), temperature source Tympanic, resp. rate 18, weight 139 lb 8.8 oz (63.3 kg). GENERAL:  Well developed, well nourished, gentleman sitting comfortably in the exam room in no acute distress. MENTAL STATUS:  Alert and oriented to person, place and time. HEAD:  Thin grey hair.  Normocephalic, atraumatic, face symmetric, no Cushingoid features. EYES:  No conjunctivitis or scleral icterus. EXTREMITIES: LEFT below the knee amputation. NEUROLOGICAL: Unremarkable. PSYCH:  Appropriate.   Office Visit on 07/31/2017  Component Date Value Ref Range Status  . Prothrombin Time 07/31/2017 44.2* 11.4 - 15.2 seconds Final   Comment: CRITICAL RESULT CALLED TO, READ BACK BY AND VERIFIED WITH: MICHELE BRUNNER AT 1150 ON 9/19 [facility]   . INR 07/31/2017 4.74*  Final   Comment: CRITICAL RESULT CALLED TO, READ BACK BY AND VERIFIED WITH: MICHELE BRUNNER AT 1150 ON 9/19 [facility]     Assessment:  Ruben Outman  Mcilrath Sr. is a 76 y.o. 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   He has had a femoral angioplasty with 2 stents placed in 1992 and a aortofemoral  bypass in 1996.  He had a left carotid to subclavian bypass graft in 2001.  He underwent left BKA in 2014 secondary to acute limb ischemia and thrombosis.  He is on chronic anticoagulation.  He has been on Coumadin 5 mg daily.  He is on daily vitamin B12, B6, and folic acid supplementation.  INR has been difficult to maintain in the past 3 months.  INR was 1.79 last week.  He avoids vitamin K rich foods.  He denies diarrhea.  He has been on antibiotics.  Factor V Leiden was negative on 07/24/2017.   Symptomatically, he denies any bleeding.  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  INR is  4.74 today.  Plan: 1. Review labs from 07/24/2017. CBC and chemistries normal.  Factor V Leiden negative. Homocysteine level slightly increased at 15.6.  Continue vitamin supplement. 2. Discuss need for long term anticoagulation per [facility] vascular clinic.  Discuss my conversation with Dr. Johnney Killian spokesperson, Clarissa.  He can receive Eliquis or Xarelto if Coumadin problematic. We discussed the risks (no medication readily available to reverse if any significant bleeding) versus benefits (no monitoring INR; fixed dose as long as good renal/hepatic function) of anti-Xa drugs.  Costs higher with anti-Xa drugs.  Discuss dose adjustments with Coumadin. 3.  RTC prn.  Addendum:  INR became available after his clinic appointment.  Dr Wendie Simmer will be notified.   Rosey Bath, MD 07/31/2017, 2:34 PM

## 2017-07-31 ENCOUNTER — Telehealth: Payer: Self-pay | Admitting: *Deleted

## 2017-07-31 ENCOUNTER — Inpatient Hospital Stay: Payer: Medicare Other

## 2017-07-31 ENCOUNTER — Encounter: Payer: Self-pay | Admitting: Hematology and Oncology

## 2017-07-31 ENCOUNTER — Inpatient Hospital Stay (HOSPITAL_BASED_OUTPATIENT_CLINIC_OR_DEPARTMENT_OTHER): Payer: Medicare Other | Admitting: Hematology and Oncology

## 2017-07-31 VITALS — BP 144/73 | HR 84 | Temp 95.5°F | Resp 18 | Wt 139.6 lb

## 2017-07-31 DIAGNOSIS — Z7901 Long term (current) use of anticoagulants: Secondary | ICD-10-CM

## 2017-07-31 DIAGNOSIS — E7211 Homocystinuria: Secondary | ICD-10-CM | POA: Diagnosis not present

## 2017-07-31 DIAGNOSIS — D6851 Activated protein C resistance: Secondary | ICD-10-CM | POA: Diagnosis not present

## 2017-07-31 DIAGNOSIS — E1151 Type 2 diabetes mellitus with diabetic peripheral angiopathy without gangrene: Secondary | ICD-10-CM

## 2017-07-31 LAB — PROTIME-INR
INR: 4.74
Prothrombin Time: 44.2 seconds — ABNORMAL HIGH (ref 11.4–15.2)

## 2017-07-31 NOTE — Telephone Encounter (Signed)
Called patient to inform him per Dr. Merlene Pulling to hold his dose of coumadin for today.  Verbalized understanding.

## 2017-07-31 NOTE — Telephone Encounter (Signed)
Marcelino Duster, RN took call from lab with critical results:  PT 44.2 and INR 4.74.  MD notified.

## 2017-07-31 NOTE — Progress Notes (Signed)
Here for follow up. Recently went to PCP Dr Ether Griffins and warfarin dose -take 10 mg then 2.5 mg the next day then 5 mg qd until today when INR rechecked. ( Warfarin doses not changed on med list as pt says doses change often but med list accurate  For the majority of time )

## 2017-09-15 IMAGING — CR DG CHEST 2V
2 series · 2 of 2 positions shown · non-contrast
Comparison: 08/21/2012

CLINICAL DATA: Cough and shortness of Breath for 2 weeks

EXAM:
CHEST - 2 VIEW

[chest pa]
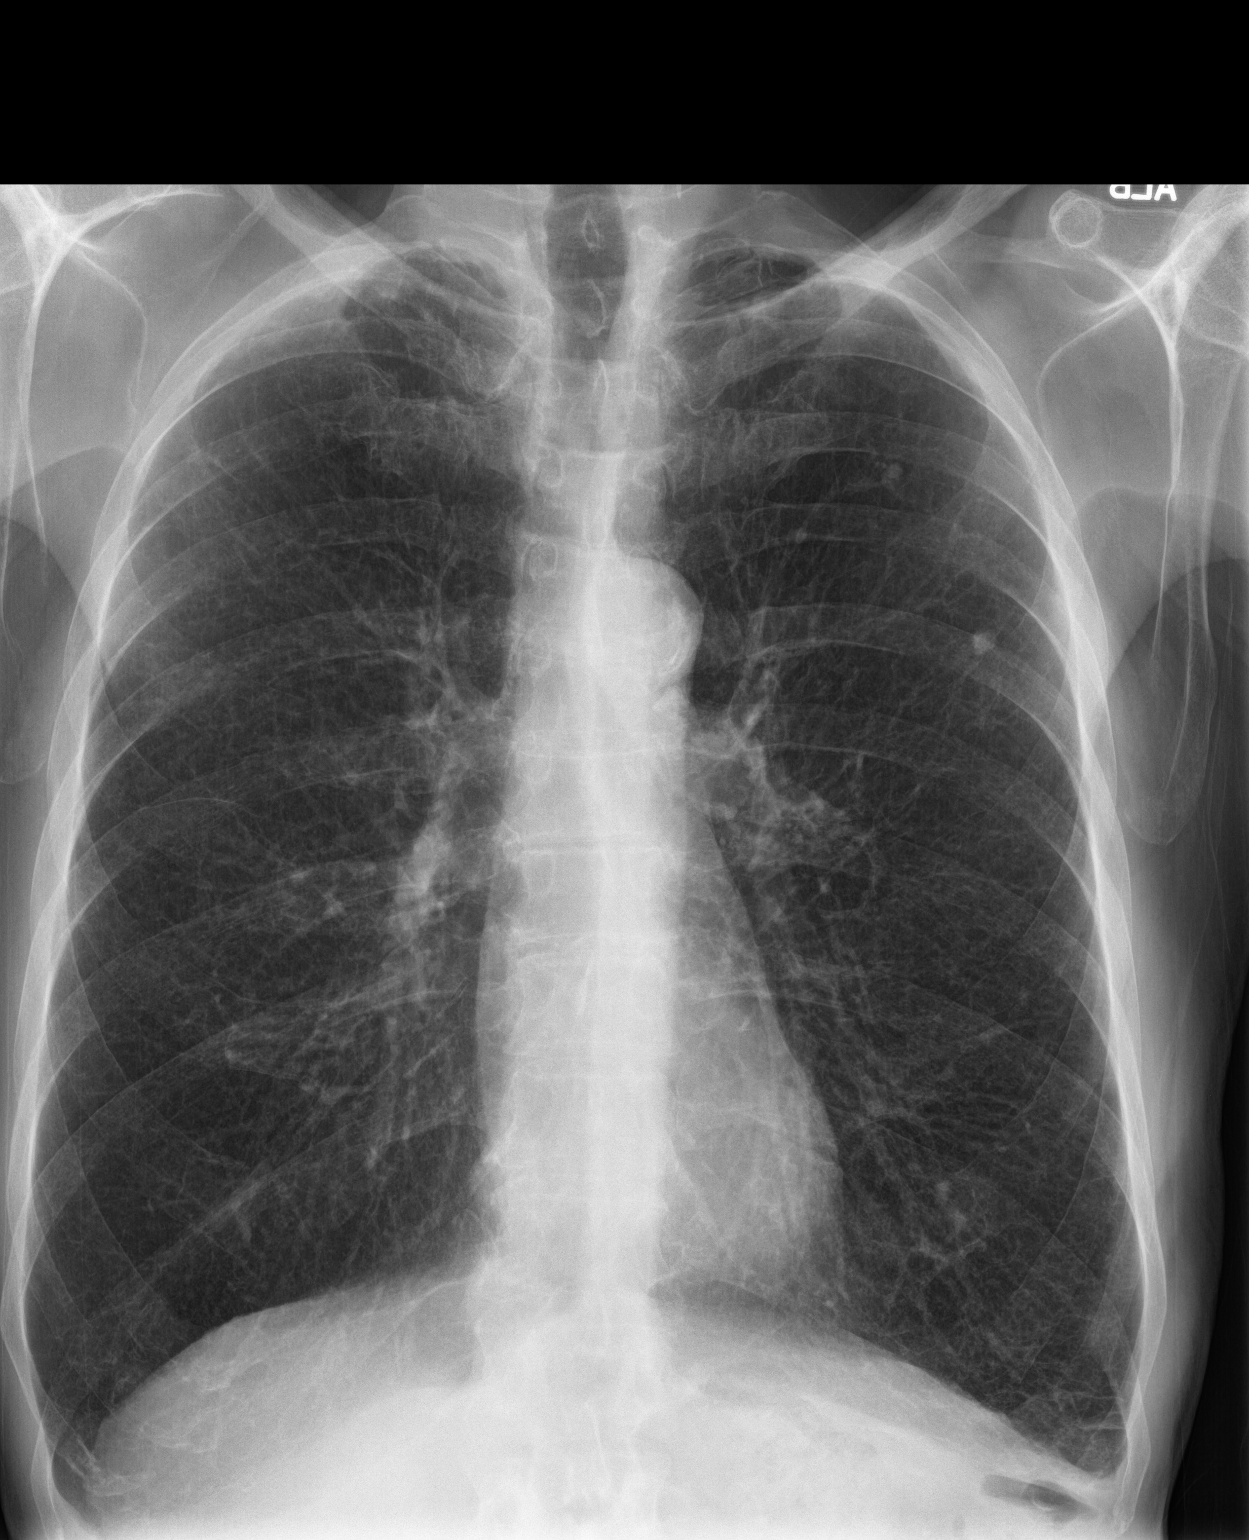

[chest lat]
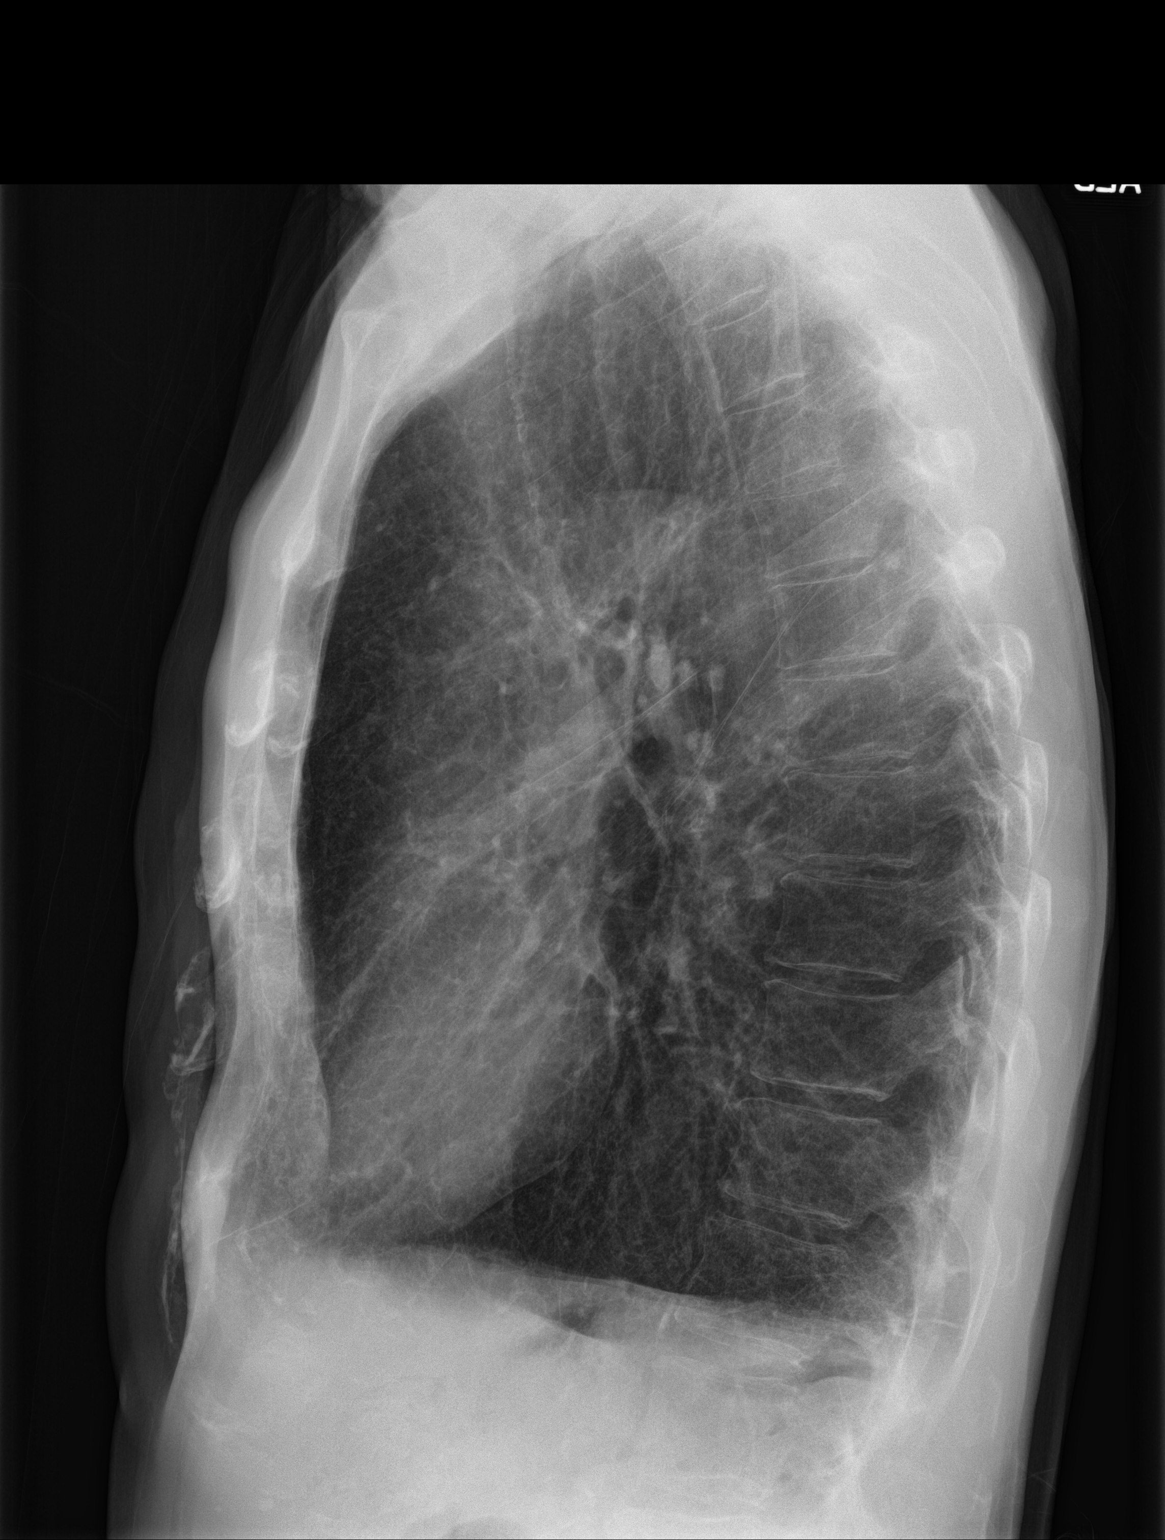

[2 of 2 positions shown; findings below may reference images not displayed]

FINDINGS: Cardiac shadow is stable. The lungs are again hyperinflated
consistent with COPD. Changes of prior granulomatous disease are
again noted. No focal infiltrate or sizable effusion is seen. No
acute bony abnormality is noted.
IMPRESSION: COPD without acute abnormality.

## 2017-09-17 ENCOUNTER — Other Ambulatory Visit: Payer: Self-pay

## 2017-09-17 ENCOUNTER — Ambulatory Visit
Admission: EM | Admit: 2017-09-17 | Discharge: 2017-09-17 | Disposition: A | Discharge status: Home / Self Care | Payer: Medicare Other | Attending: Family Medicine | Admitting: Family Medicine

## 2017-09-17 ENCOUNTER — Ambulatory Visit: Payer: Medicare Other

## 2017-09-17 ENCOUNTER — Encounter: Payer: Self-pay | Admitting: Emergency Medicine

## 2017-09-17 DIAGNOSIS — Z7984 Long term (current) use of oral hypoglycemic drugs: Secondary | ICD-10-CM | POA: Insufficient documentation

## 2017-09-17 DIAGNOSIS — R05 Cough: Secondary | ICD-10-CM

## 2017-09-17 DIAGNOSIS — E039 Hypothyroidism, unspecified: Secondary | ICD-10-CM | POA: Diagnosis not present

## 2017-09-17 DIAGNOSIS — G5600 Carpal tunnel syndrome, unspecified upper limb: Secondary | ICD-10-CM | POA: Insufficient documentation

## 2017-09-17 DIAGNOSIS — Z79899 Other long term (current) drug therapy: Secondary | ICD-10-CM | POA: Insufficient documentation

## 2017-09-17 DIAGNOSIS — M5489 Other dorsalgia: Secondary | ICD-10-CM

## 2017-09-17 DIAGNOSIS — E119 Type 2 diabetes mellitus without complications: Secondary | ICD-10-CM | POA: Insufficient documentation

## 2017-09-17 DIAGNOSIS — R509 Fever, unspecified: Secondary | ICD-10-CM | POA: Diagnosis present

## 2017-09-17 DIAGNOSIS — Z7901 Long term (current) use of anticoagulants: Secondary | ICD-10-CM | POA: Diagnosis not present

## 2017-09-17 DIAGNOSIS — E7801 Familial hypercholesterolemia: Secondary | ICD-10-CM | POA: Insufficient documentation

## 2017-09-17 DIAGNOSIS — Z880 Allergy status to penicillin: Secondary | ICD-10-CM | POA: Insufficient documentation

## 2017-09-17 DIAGNOSIS — Z87891 Personal history of nicotine dependence: Secondary | ICD-10-CM | POA: Diagnosis not present

## 2017-09-17 DIAGNOSIS — J441 Chronic obstructive pulmonary disease with (acute) exacerbation: Secondary | ICD-10-CM | POA: Insufficient documentation

## 2017-09-17 MED ORDER — PREDNISONE 10 MG PO TABS
ORAL_TABLET | ORAL | 0 refills | Status: DC
Start: 2017-09-17 — End: 2017-11-25

## 2017-09-17 NOTE — Discharge Instructions (Signed)
Prednisone as prescribed.  You need to see Pulmonary.  Take care  Dr. Adriana Simasook

## 2017-09-17 NOTE — ED Provider Notes (Signed)
[facility]    CSN: [id number] Arrival date & time: 09/17/17  1350  History   Chief Complaint Chief Complaint  Patient presents with  . Back Pain  . Fever  . Cough   HPI 76 year old male with an extensive past medical history including COPD and homocystinemia/hypercoagulable disorder presents with the above complaints.  Patient has had ongoing issues with worsening shortness of breath and cough as well as fever since September.  He has been treated with 2 courses of antibiotics and 2 courses of prednisone.  He states that he feels better on prednisone and then when he comes off he feels poorly.  For the past 6 days he has had worsening shortness of breath with exertion, productive cough, and fever.  He states he had a fever of 100.4 last night.  He also reports low back pain.  He has recently seen cardiology and had a negative stress test.  He has yet to see pulmonology.  A referral is in place.  He has not been compliant with his inhalers until the past week and a half.  He has had a recent chest x-ray on 10/12 which was negative for acute findings.  He states that his oxygen saturation was 89% this morning.  He is not feeling well.  He is very concerned about his symptoms.  No other complaints or concerns at this time.  Past Medical History:  Diagnosis Date  . Allergy   . Anemia   . Cataract   . Clotting disorder (HCC)   . COPD (chronic obstructive pulmonary disease) (HCC)   . Diabetes mellitus without complication [facility])    Patient Active Problem List   Diagnosis Date Noted  . Current use of long term anticoagulation 07/31/2017  . Acquired hypothyroidism 04/25/2015  . 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 History of anticoagulant therapy 04/25/2015  . 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  . DM (diabetes mellitus), 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/2015  . 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  . Disorder of subcutaneous tissue 04/25/2015  . Angiopathy, 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  . Cigarette smoker 04/25/2015  . 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 ABDOMINAL AORTIC ENDOVASCULAR STENT GRAFT  1996  . APPENDECTOMY    . CAROTID-SUBCLAVIAN BYPASS GRAFT Left 2001  . COLONOSCOPY    . CORONARY ANGIOPLASTY WITH STENT PLACEMENT  1992, 2004  . FEMORAL-POPLITEAL BYPASS GRAFT Left 1996  . LEG AMPUTATION BELOW KNEE Left 2014   Home Medications    Prior to Admission medications   Medication Sig Start Date End Date Taking? Authorizing Provider  albuterol (PROVENTIL HFA;VENTOLIN HFA) 108 (90 Base) MCG/ACT inhaler Inhale 1 puff into the lungs daily. 02/05/11   [provider]  albuterol (VENTOLIN HFA) 108 (90 BASE) MCG/ACT inhaler Inhale 2 puffs into the lungs 4 (four) times daily as needed. 02/05/11   [provider]  budesonide-formoterol (SYMBICORT) 160-4.5 MCG/ACT inhaler Inhale 2 puffs into the lungs 2 (two) times daily. 03/14/17 03/14/18  [provider]  ferrous gluconate (FERGON) 324 MG tablet Take 324 mg by mouth daily. 06/27/17   [provider]  glucose blood (ONE TOUCH ULTRA TEST) test strip  06/01/13   [provider]  ipratropium (ATROVENT HFA) 17 MCG/ACT inhaler Inhale 2 puffs into the lungs QID. 03/14/17 03/14/18  [provider]  levocetirizine (XYZAL) 5 MG tablet Take 5 mg by mouth daily. 03/14/17 03/14/18  [provider]  levothyroxine (SYNTHROID, LEVOTHROID) 112 MCG tablet Take 1 tablet by mouth daily. 11/27/15   [provider]  metFORMIN (GLUCOPHAGE) 500 MG tablet Take 500 mg by mouth 2 (two) times daily with a meal.    [provider]  predniSONE (DELTASONE) 10 MG tablet 4 tablets daily x 3 days, then 3 tablets  daily x 3 days, then 2 tablets daily x 3 days, then 1 tablet daily x 3 days, then 0.5 tablets daily x 4 days. 09/17/17   Tommie Samsook, Emillie Chasen G, DO  rosuvastatin (CRESTOR) 10 MG tablet Take 10 mg by mouth daily. 03/15/17 03/15/18  [provider]  warfarin (COUMADIN) 5 MG tablet Take 5 mg by mouth. Currently taking 5 mg 6 days a week and 7.5 one day a week. 06/18/17   [provider]  warfarin (COUMADIN) 7.5 MG tablet Take 1 tablet (7.5 mg total) by mouth daily. 3.75 mg Wed and Sun; 7.5 mg other days 05/11/16   Reubin MilanBerglund, Laura H, MD    Family History Family History  Problem Relation Age of Onset  . CAD Mother   . Hypothyroidism Mother   . Cancer Father     Social History Social History   Tobacco Use  . Smoking status: Former Smoker    Packs/day: 1.00    Years: 50.00    Pack years: 50.00    Types: Cigarettes    Last attempt to quit: 11/24/2015    Years since quitting: 1.8  . Smokeless tobacco: Never Used  Substance Use Topics  . Alcohol use: No    Alcohol/week: 0.0 oz  . Drug use: No   Allergies   Iodinated diagnostic agents; Penicillins; and Shellfish-derived products  Review of Systems Review of Systems  Constitutional: Positive for fatigue and fever.  Respiratory: Positive for cough and shortness of breath.   Musculoskeletal: Positive for back pain.  All other systems reviewed and are negative.  Physical Exam Triage Vital Signs ED Triage Vitals  Enc Vitals Group     BP 09/17/17 1402 114/68     Pulse Rate 09/17/17 1402 94     Resp 09/17/17 1402 18     Temp 09/17/17 1402 98.1 F (36.7 C)     Temp Source 09/17/17 1402 Oral     SpO2 09/17/17 1402 92 %     Weight 09/17/17 1401 140 lb (63.5 kg)     Height 09/17/17 1401 5\' 8"  (1.727 m)     Head Circumference --      Peak Flow --      Pain Score 09/17/17 1401 0     Pain Loc --      Pain Edu? --      Excl. in GC? --    Updated Vital Signs BP 114/68 (BP Location: Left Arm)   Pulse 94   Temp 98.1 F (36.7 C)  (Oral)   Resp 18   Ht 5\' 8"  (1.727 m)   Wt 140 lb (63.5 kg)   SpO2 92%   BMI 21.29 kg/m   Physical Exam  Constitutional: He is oriented to person, place, and time. He appears well-developed. No distress.  HENT:  Head: Normocephalic and atraumatic.  Nose: Nose normal.  Mouth/Throat: Oropharynx is clear and moist.  Eyes: Conjunctivae are normal. No scleral icterus.  Cardiovascular: Normal rate and regular rhythm.  No murmur heard. Pulmonary/Chest: Effort normal.  Decreased  breath sounds throughout.  No respiratory distress.  No appreciable wheezing or crackles.  Neurological: He is alert and oriented to person, place, and time.  Psychiatric: He has a normal mood and affect. His behavior is normal.  Vitals reviewed.  UC Treatments / Results  Labs (all labs ordered are listed, but only abnormal results are displayed) Labs Reviewed - No data to display  EKG  EKG Interpretation None      Radiology Dg Chest 2 View  Result Date: 09/17/2017 CLINICAL DATA:  Patient c/o cough, chest congestion, lower back pain for over a week. Patient has been treated with antibiotic and Prednisone recently. Hx of COPD. Former smoker. NKI. No hx of cancer. EXAM: CHEST  2 VIEW COMPARISON:  09/29/2015, 06/26/2007 FINDINGS: Lungs are hyperinflated. Perihilar bronchitic changes are present and stable. Heart size is normal. Calcified granuloma is identified in the left upper lobe. There are no focal consolidations or pleural effusions. No pulmonary edema. IMPRESSION: Hyperinflation and bronchitic changes. No focal acute pulmonary abnormality. Electronically Signed   By: Norva PavlovElizabeth  Brown M.D.   On: 09/17/2017 15:06    Procedures Procedures (including critical care time)  Medications Ordered in UC Medications - No data to display   Initial Impression / Assessment and Plan / UC Course  I have reviewed the triage vital signs and the nursing notes.  Pertinent labs & imaging results that were available  during my care of the patient were reviewed by me and considered in my medical decision making (see chart for details).     76 year old male presents with ongoing dyspnea.  Chest x-ray performed today and revealed no acute abnormality.  He seems to be experiencing worsening COPD.  He has had a recent cardiac evaluation.  He is adamant for steroids and oral antibiotic.  I see no indication for an antibiotic.  I am reluctant to prescribe steroids but did so given his improvement with prednisone.  We discussed side effects and potential issues with adrenal insufficiency.  Patient still adamant for steroids.  Long taper of prednisone given.  Needs follow-up with primary and pulmonology.  Final Clinical Impressions(s) / UC Diagnoses   Final diagnoses:  COPD exacerbation [facility](HCC)   ED Discharge Orders        Ordered    predniSONE (DELTASONE) 10 MG tablet     09/17/17 1521     Controlled Substance Prescriptions [place] Controlled Substance Registry consulted? Not Applicable   Tommie SamsCook, Javarious Elsayed G, DO 09/17/17 1540

## 2017-09-17 NOTE — ED Triage Notes (Signed)
Patient c/o cough, chest congestion, lower back pain for over a week.  Patient has been treated with antibiotic and Prednisone recently.

## 2017-11-25 ENCOUNTER — Other Ambulatory Visit: Payer: Self-pay

## 2017-11-25 ENCOUNTER — Encounter: Payer: Self-pay | Admitting: *Deleted

## 2017-12-02 NOTE — Discharge Instructions (Signed)

## 2017-12-03 ENCOUNTER — Encounter
Admission: RE | Disposition: A | Discharge status: Home / Self Care | Payer: Self-pay | Source: Ambulatory Visit | Attending: Ophthalmology

## 2017-12-03 ENCOUNTER — Ambulatory Visit
Admission: RE | Admit: 2017-12-03 | Discharge: 2017-12-03 | Disposition: A | Discharge status: Home / Self Care | Payer: Medicare Other | Source: Ambulatory Visit | Attending: Ophthalmology | Admitting: Ophthalmology

## 2017-12-03 ENCOUNTER — Ambulatory Visit: Payer: Medicare Other | Admitting: Anesthesiology

## 2017-12-03 DIAGNOSIS — Z87891 Personal history of nicotine dependence: Secondary | ICD-10-CM | POA: Insufficient documentation

## 2017-12-03 DIAGNOSIS — E7211 Homocystinuria: Secondary | ICD-10-CM | POA: Diagnosis not present

## 2017-12-03 DIAGNOSIS — Z79899 Other long term (current) drug therapy: Secondary | ICD-10-CM | POA: Insufficient documentation

## 2017-12-03 DIAGNOSIS — H2512 Age-related nuclear cataract, left eye: Secondary | ICD-10-CM | POA: Diagnosis not present

## 2017-12-03 DIAGNOSIS — J449 Chronic obstructive pulmonary disease, unspecified: Secondary | ICD-10-CM | POA: Insufficient documentation

## 2017-12-03 DIAGNOSIS — E079 Disorder of thyroid, unspecified: Secondary | ICD-10-CM | POA: Insufficient documentation

## 2017-12-03 DIAGNOSIS — Z7901 Long term (current) use of anticoagulants: Secondary | ICD-10-CM | POA: Diagnosis not present

## 2017-12-03 DIAGNOSIS — E119 Type 2 diabetes mellitus without complications: Secondary | ICD-10-CM | POA: Insufficient documentation

## 2017-12-03 DIAGNOSIS — I739 Peripheral vascular disease, unspecified: Secondary | ICD-10-CM | POA: Insufficient documentation

## 2017-12-03 HISTORY — DX: Hypothyroidism, unspecified: E03.9

## 2017-12-03 HISTORY — DX: Presence of dental prosthetic device (complete) (partial): Z97.2

## 2017-12-03 HISTORY — DX: Homocystinuria: E72.11

## 2017-12-03 HISTORY — PX: CATARACT EXTRACTION W/PHACO: SHX586

## 2017-12-03 HISTORY — DX: Peripheral vascular disease, unspecified: I73.9

## 2017-12-03 LAB — GLUCOSE, CAPILLARY
GLUCOSE-CAPILLARY: 103 mg/dL — AB (ref 65–99)
GLUCOSE-CAPILLARY: 108 mg/dL — AB (ref 65–99)

## 2017-12-03 SURGERY — PHACOEMULSIFICATION, CATARACT, WITH IOL INSERTION
Anesthesia: General | Laterality: Left | Wound class: Clean

## 2017-12-03 MED ORDER — MIDAZOLAM HCL 2 MG/2ML IJ SOLN
INTRAMUSCULAR | Status: DC | PRN
  Administered 2017-12-03: 2 mg via INTRAVENOUS

## 2017-12-03 MED ORDER — PROMETHAZINE HCL 25 MG/ML IJ SOLN: 6.2500 mg | INTRAMUSCULAR | Status: DC | PRN

## 2017-12-03 MED ORDER — OXYCODONE HCL 5 MG PO TABS: 5.0000 mg | ORAL_TABLET | Freq: Once | ORAL | Status: DC | PRN

## 2017-12-03 MED ORDER — MEPERIDINE HCL 25 MG/ML IJ SOLN: 6.2500 mg | INTRAMUSCULAR | Status: DC | PRN

## 2017-12-03 MED ORDER — OXYCODONE HCL 5 MG/5ML PO SOLN: 5.0000 mg | Freq: Once | ORAL | Status: DC | PRN

## 2017-12-03 MED ORDER — ARMC OPHTHALMIC DILATING DROPS
1.0000 "application " | OPHTHALMIC | Status: DC | PRN
  Administered 2017-12-03 (×3): 1 via OPHTHALMIC

## 2017-12-03 MED ORDER — LIDOCAINE HCL (PF) 2 % IJ SOLN
INTRAOCULAR | Status: DC | PRN
  Administered 2017-12-03: 1 mL via INTRAOCULAR

## 2017-12-03 MED ORDER — FENTANYL CITRATE (PF) 100 MCG/2ML IJ SOLN: 25.0000 ug | INTRAMUSCULAR | Status: DC | PRN

## 2017-12-03 MED ORDER — SODIUM HYALURONATE 10 MG/ML IO SOLN
INTRAOCULAR | Status: DC | PRN
  Administered 2017-12-03: 0.55 mL via INTRAOCULAR

## 2017-12-03 MED ORDER — EPINEPHRINE PF 1 MG/ML IJ SOLN
INTRAOCULAR | Status: DC | PRN
  Administered 2017-12-03: 125 mL via OPHTHALMIC

## 2017-12-03 MED ORDER — LACTATED RINGERS IV SOLN: 10.0000 mL/h | INTRAVENOUS | Status: DC

## 2017-12-03 MED ORDER — FENTANYL CITRATE (PF) 100 MCG/2ML IJ SOLN
INTRAMUSCULAR | Status: DC | PRN
  Administered 2017-12-03: 50 ug via INTRAVENOUS

## 2017-12-03 MED ORDER — MOXIFLOXACIN HCL 0.5 % OP SOLN
OPHTHALMIC | Status: DC | PRN
  Administered 2017-12-03: 0.2 mL via OPHTHALMIC

## 2017-12-03 MED ORDER — SODIUM HYALURONATE 23 MG/ML IO SOLN
INTRAOCULAR | Status: DC | PRN
  Administered 2017-12-03: 0.6 mL via INTRAOCULAR

## 2017-12-03 SURGICAL SUPPLY — 16 items
CANNULA ANT/CHMB 27GA (MISCELLANEOUS) ×3 IMPLANT
DISSECTOR HYDRO NUCLEUS 50X22 (MISCELLANEOUS) ×3 IMPLANT
GLOVE BIO SURGEON STRL SZ8 (GLOVE) ×3 IMPLANT
GLOVE SURG LX 7.5 STRW (GLOVE) ×2
GLOVE SURG LX STRL 7.5 STRW (GLOVE) ×1 IMPLANT
GOWN STRL REUS W/ TWL LRG LVL3 (GOWN DISPOSABLE) ×2 IMPLANT
GOWN STRL REUS W/TWL LRG LVL3 (GOWN DISPOSABLE) ×4
LENS IOL TECNIS ITEC 18.5 (Intraocular Lens) ×3 IMPLANT
MARKER SKIN DUAL TIP RULER LAB (MISCELLANEOUS) ×3 IMPLANT
PACK CATARACT (MISCELLANEOUS) ×3 IMPLANT
PACK DR. KING ARMS (PACKS) ×3 IMPLANT
PACK EYE AFTER SURG (MISCELLANEOUS) ×3 IMPLANT
SYR 3ML LL SCALE MARK (SYRINGE) ×3 IMPLANT
SYR TB 1ML LUER SLIP (SYRINGE) ×3 IMPLANT
WATER STERILE IRR 500ML POUR (IV SOLUTION) ×3 IMPLANT
WIPE NON LINTING 3.25X3.25 (MISCELLANEOUS) ×3 IMPLANT

## 2017-12-03 NOTE — Anesthesia Postprocedure Evaluation (Signed)
Anesthesia Post Note  Patient: Ruben MaduraWilliam T Fricker Sr.  Procedure(s) Performed: CATARACT EXTRACTION PHACO AND INTRAOCULAR LENS PLACEMENT (IOC) LEFT DIABETIC (Left )  Patient location during evaluation: PACU Anesthesia Type: General Level of consciousness: awake and alert Pain management: pain level controlled Vital Signs Assessment: post-procedure vital signs reviewed and stable Respiratory status: spontaneous breathing, nonlabored ventilation, respiratory function stable and patient connected to nasal cannula oxygen Cardiovascular status: blood pressure returned to baseline and stable Postop Assessment: no apparent nausea or vomiting Anesthetic complications: no    SCOURAS, NICOLE ELAINE

## 2017-12-03 NOTE — Anesthesia Preprocedure Evaluation (Addendum)
Anesthesia Evaluation  Patient identified by MRN, date of birth, ID band Patient awake    Reviewed: Allergy & Precautions, H&P , NPO status , Patient's Chart, lab work & pertinent test results, reviewed documented beta blocker date and time   Airway Mallampati: II  TM Distance: >3 FB Neck ROM: full    Dental no notable dental hx.    Pulmonary COPD, former smoker,    Pulmonary exam normal breath sounds clear to auscultation       Cardiovascular Exercise Tolerance: Good + Peripheral Vascular Disease   Rhythm:regular Rate:Normal  Hyperhomocysteinemia  Multiple vascular procedures including bypass  Leg prosthesis   Neuro/Psych negative neurological ROS  negative psych ROS   GI/Hepatic negative GI ROS, Neg liver ROS,   Endo/Other  diabetesHypothyroidism   Renal/GU negative Renal ROS  negative genitourinary   Musculoskeletal   Abdominal   Peds  Hematology negative hematology ROS (+)   Anesthesia Other Findings   Reproductive/Obstetrics negative OB ROS                            Anesthesia Physical Anesthesia Plan  ASA: III  Anesthesia Plan: General   Post-op Pain Management:    Induction:   PONV Risk Score and Plan:   Airway Management Planned:   Additional Equipment:   Intra-op Plan:   Post-operative Plan:   Informed Consent: I have reviewed the patients History and Physical, chart, labs and discussed the procedure including the risks, benefits and alternatives for the proposed anesthesia with the patient or authorized representative who has indicated his/her understanding and acceptance.   Dental Advisory Given  Plan Discussed with: CRNA  Anesthesia Plan Comments:         Anesthesia Quick Evaluation

## 2017-12-03 NOTE — Transfer of Care (Signed)
Immediate Anesthesia Transfer of Care Note  Patient: Ruben MaduraWilliam T Andreen Sr.  Procedure(s) Performed: CATARACT EXTRACTION PHACO AND INTRAOCULAR LENS PLACEMENT (IOC) LEFT DIABETIC (Left )  Patient Location: PACU  Anesthesia Type: General  Level of Consciousness: awake, alert  and patient cooperative  Airway and Oxygen Therapy: Patient Spontanous Breathing and Patient connected to supplemental oxygen  Post-op Assessment: Post-op Vital signs reviewed, Patient's Cardiovascular Status Stable, Respiratory Function Stable, Patent Airway and No signs of Nausea or vomiting  Post-op Vital Signs: Reviewed and stable  Complications: No apparent anesthesia complications

## 2017-12-03 NOTE — Op Note (Signed)
OPERATIVE NOTE  Ruben PlumWilliam T Botting Sr. [id number] 12/03/2017   PREOPERATIVE DIAGNOSIS:  Nuclear sclerotic cataract left eye.  H25.12   POSTOPERATIVE DIAGNOSIS:    Nuclear sclerotic cataract left eye.     PROCEDURE:  Phacoemusification with posterior chamber intraocular lens placement of the left eye   LENS:   Implant Name Type Inv. Item Serial No. Manufacturer Lot No. LRB No. Used  LENS IOL DIOP 18.5 - [id number] Intraocular Lens LENS IOL DIOP 18.5 [id number]  Left 1       PCB00 +18.5   ULTRASOUND TIME: 0 minutes 53.7 seconds.  CDE 8.92   SURGEON:  Willey BladeBradley Olivier Frayre, MD, MPH   ANESTHESIA:  Topical with tetracaine drops augmented with 1% preservative-free intracameral lidocaine.  ESTIMATED BLOOD LOSS: <1 mL   COMPLICATIONS:  None.   DESCRIPTION OF PROCEDURE:  The patient was identified in the holding room and transported to the operating room and placed in the supine position under the operating microscope.  The left eye was identified as the operative eye and it was prepped and draped in the usual sterile ophthalmic fashion.   A 1.0 millimeter clear-corneal paracentesis was made at the 5:00 position. 0.5 ml of preservative-free 1% lidocaine with epinephrine was injected into the anterior chamber.  The anterior chamber was filled with Healon 5 viscoelastic.  A 2.4 millimeter keratome was used to make a near-clear corneal incision at the 2:00 position.  A curvilinear capsulorrhexis was made with a cystotome and capsulorrhexis forceps.  Balanced salt solution was used to hydrodissect and hydrodelineate the nucleus.   Phacoemulsification was then used in stop and chop fashion to remove the lens nucleus and epinucleus.  The remaining cortex was then removed using the irrigation and aspiration handpiece. Healon was then placed into the capsular bag to distend it for lens placement.  A lens was then injected into the capsular bag.  The remaining viscoelastic was aspirated.   Wounds were  hydrated with balanced salt solution.  The anterior chamber was inflated to a physiologic pressure with balanced salt solution.  Intracameral vigamox 0.1 mL undiltued was injected into the eye and a drop placed onto the ocular surface.  No wound leaks were noted.  The patient was taken to the recovery room in stable condition without complications of anesthesia or surgery  Willey BladeBradley Elim Economou 12/03/2017, 8:50 AM

## 2017-12-03 NOTE — H&P (Signed)
The History and Physical notes are on paper, have been signed, and are to be scanned.   I have examined the patient and there are no changes to the H&P.   Willey BladeBradley Mauricia Mertens 12/03/2017 8:12 AM

## 2017-12-03 NOTE — Anesthesia Procedure Notes (Signed)
Procedure Name: MAC Date/Time: 12/03/2017 8:24 AM Performed by: Janna Arch, CRNA Pre-anesthesia Checklist: Patient identified, Emergency Drugs available, Suction available and Patient being monitored Patient Re-evaluated:Patient Re-evaluated prior to induction Oxygen Delivery Method: Nasal cannula

## 2018-03-03 NOTE — Discharge Instructions (Signed)

## 2018-03-04 ENCOUNTER — Ambulatory Visit
Admission: RE | Admit: 2018-03-04 | Discharge: 2018-03-04 | Disposition: A | Discharge status: Home / Self Care | Payer: Medicare Other | Source: Ambulatory Visit | Attending: Ophthalmology | Admitting: Ophthalmology

## 2018-03-04 ENCOUNTER — Ambulatory Visit: Payer: Medicare Other | Admitting: Anesthesiology

## 2018-03-04 ENCOUNTER — Encounter
Admission: RE | Disposition: A | Discharge status: Home / Self Care | Payer: Self-pay | Source: Ambulatory Visit | Attending: Ophthalmology

## 2018-03-04 DIAGNOSIS — I739 Peripheral vascular disease, unspecified: Secondary | ICD-10-CM | POA: Diagnosis not present

## 2018-03-04 DIAGNOSIS — Z87891 Personal history of nicotine dependence: Secondary | ICD-10-CM | POA: Insufficient documentation

## 2018-03-04 DIAGNOSIS — Z91013 Allergy to seafood: Secondary | ICD-10-CM | POA: Diagnosis not present

## 2018-03-04 DIAGNOSIS — E7211 Homocystinuria: Secondary | ICD-10-CM | POA: Diagnosis not present

## 2018-03-04 DIAGNOSIS — J449 Chronic obstructive pulmonary disease, unspecified: Secondary | ICD-10-CM | POA: Diagnosis not present

## 2018-03-04 DIAGNOSIS — H2511 Age-related nuclear cataract, right eye: Secondary | ICD-10-CM | POA: Diagnosis not present

## 2018-03-04 DIAGNOSIS — Z880 Allergy status to penicillin: Secondary | ICD-10-CM | POA: Diagnosis not present

## 2018-03-04 DIAGNOSIS — Z89512 Acquired absence of left leg below knee: Secondary | ICD-10-CM | POA: Insufficient documentation

## 2018-03-04 HISTORY — PX: CATARACT EXTRACTION W/PHACO: SHX586

## 2018-03-04 HISTORY — DX: Dyspnea, unspecified: R06.00

## 2018-03-04 LAB — GLUCOSE, CAPILLARY
GLUCOSE-CAPILLARY: 115 mg/dL — AB (ref 65–99)
GLUCOSE-CAPILLARY: 107 mg/dL — AB (ref 65–99)

## 2018-03-04 SURGERY — PHACOEMULSIFICATION, CATARACT, WITH IOL INSERTION
Anesthesia: General | Site: Eye | Laterality: Right | Wound class: Clean

## 2018-03-04 MED ORDER — SODIUM HYALURONATE 23 MG/ML IO SOLN
INTRAOCULAR | Status: DC | PRN
  Administered 2018-03-04: 0.6 mL via INTRAOCULAR

## 2018-03-04 MED ORDER — MOXIFLOXACIN HCL 0.5 % OP SOLN
OPHTHALMIC | Status: DC | PRN
  Administered 2018-03-04: 1 [drp] via OPHTHALMIC

## 2018-03-04 MED ORDER — ACETAMINOPHEN 160 MG/5ML PO SOLN: 325.0000 mg | ORAL | Status: DC | PRN

## 2018-03-04 MED ORDER — ARMC OPHTHALMIC DILATING DROPS
1.0000 "application " | OPHTHALMIC | Status: DC | PRN
  Administered 2018-03-04 (×3): 1 via OPHTHALMIC

## 2018-03-04 MED ORDER — EPINEPHRINE PF 1 MG/ML IJ SOLN
INTRAOCULAR | Status: DC | PRN
  Administered 2018-03-04: 123 mL via OPHTHALMIC

## 2018-03-04 MED ORDER — SODIUM HYALURONATE 10 MG/ML IO SOLN
INTRAOCULAR | Status: DC | PRN
  Administered 2018-03-04: 0.55 mL via INTRAOCULAR

## 2018-03-04 MED ORDER — LIDOCAINE HCL (PF) 2 % IJ SOLN
INTRAOCULAR | Status: DC | PRN
  Administered 2018-03-04: 1 mL via INTRAOCULAR

## 2018-03-04 MED ORDER — LACTATED RINGERS IV SOLN: INTRAVENOUS | Status: DC

## 2018-03-04 MED ORDER — ONDANSETRON HCL 4 MG/2ML IJ SOLN: 4.0000 mg | Freq: Once | INTRAMUSCULAR | Status: DC | PRN

## 2018-03-04 MED ORDER — MIDAZOLAM HCL 2 MG/2ML IJ SOLN
INTRAMUSCULAR | Status: DC | PRN
  Administered 2018-03-04 (×2): 1 mg via INTRAVENOUS

## 2018-03-04 MED ORDER — ACETAMINOPHEN 325 MG PO TABS
325.0000 mg | ORAL_TABLET | ORAL | Status: DC | PRN
  Administered 2018-03-04: 650 mg via ORAL

## 2018-03-04 MED ORDER — FENTANYL CITRATE (PF) 100 MCG/2ML IJ SOLN
INTRAMUSCULAR | Status: DC | PRN
  Administered 2018-03-04 (×2): 25 ug via INTRAVENOUS

## 2018-03-04 SURGICAL SUPPLY — 17 items
CANNULA ANT/CHMB 27GA (MISCELLANEOUS) ×3 IMPLANT
DISSECTOR HYDRO NUCLEUS 50X22 (MISCELLANEOUS) ×3 IMPLANT
GLOVE BIO SURGEON STRL SZ8 (GLOVE) ×3 IMPLANT
GLOVE SURG LX 7.5 STRW (GLOVE) ×2
GLOVE SURG LX STRL 7.5 STRW (GLOVE) ×1 IMPLANT
GOWN STRL REUS W/ TWL LRG LVL3 (GOWN DISPOSABLE) ×2 IMPLANT
GOWN STRL REUS W/TWL LRG LVL3 (GOWN DISPOSABLE) ×4
KNIFE SIDECUT EYE (MISCELLANEOUS) ×3 IMPLANT
LENS IOL TECNIS ITEC 19.0 (Intraocular Lens) ×3 IMPLANT
MARKER SKIN DUAL TIP RULER LAB (MISCELLANEOUS) ×3 IMPLANT
PACK CATARACT (MISCELLANEOUS) ×3 IMPLANT
PACK DR. KING ARMS (PACKS) ×3 IMPLANT
PACK EYE AFTER SURG (MISCELLANEOUS) ×3 IMPLANT
SYR 3ML LL SCALE MARK (SYRINGE) ×3 IMPLANT
SYR TB 1ML LUER SLIP (SYRINGE) ×3 IMPLANT
WATER STERILE IRR 500ML POUR (IV SOLUTION) ×3 IMPLANT
WIPE NON LINTING 3.25X3.25 (MISCELLANEOUS) ×3 IMPLANT

## 2018-03-04 NOTE — H&P (Signed)
The History and Physical notes are on paper, have been signed, and are to be scanned.   I have examined the patient and there are no changes to the H&P.   Ruben BladeBradley Cleone Wright 03/04/2018 7:30 AM

## 2018-03-04 NOTE — Op Note (Signed)
OPERATIVE NOTE  Ruben PlumWilliam T Fagerstrom Wright. [id number] 03/04/2018   PREOPERATIVE DIAGNOSIS:  Nuclear sclerotic cataract right eye.  H25.11   POSTOPERATIVE DIAGNOSIS:    Nuclear sclerotic cataract right eye.     PROCEDURE:  Phacoemusification with posterior chamber intraocular lens placement of the right eye   LENS:   Implant Name Type Inv. Item Serial No. Manufacturer Lot No. LRB No. Used  LENS IOL DIOP 19.0 - [id number] Intraocular Lens LENS IOL DIOP 19.0 [id number]  Right 1       PCB00 +19.0   ULTRASOUND TIME: 1 minutes 00 seconds.  CDE 7.34   SURGEON:  Willey BladeBradley Rudi Bunyard, MD, MPH  ANESTHESIOLOGIST: Anesthesiologist: Jola BabinskiHarker, Elsje, MD CRNA: Arlice ColtBurnett, Gena, CRNA   ANESTHESIA:  Topical with tetracaine drops augmented with 1% preservative-free intracameral lidocaine.  ESTIMATED BLOOD LOSS: less than 1 mL.   COMPLICATIONS:  None.   DESCRIPTION OF PROCEDURE:  The patient was identified in the holding room and transported to the operating room and placed in the supine position under the operating microscope.  The right eye was identified as the operative eye and it was prepped and draped in the usual sterile ophthalmic fashion.   A 1.0 millimeter clear-corneal paracentesis was made at the 10:00 position. 0.5 ml of preservative-free 1% lidocaine with epinephrine was injected into the anterior chamber.  The anterior chamber was filled with Healon 5 viscoelastic.  A 2.4 millimeter keratome was used to make a near-clear corneal incision at the 8:00 position.  A curvilinear capsulorrhexis was made with a cystotome and capsulorrhexis forceps.  Balanced salt solution was used to hydrodissect and hydrodelineate the nucleus.   Phacoemulsification was then used in stop and chop fashion to remove the lens nucleus and epinucleus.  There was some difficulty in rotating the lens with the incision configuration and the patient's prominent brow, so a second paracentesis was made at 11:00.    The remaining  cortex was then removed using the irrigation and aspiration handpiece. Healon was then placed into the capsular bag to distend it for lens placement.  A lens was then injected into the capsular bag.  The lens was caught in the incision and was grasped with kelman mcpherson forceps to insert the rest of the way into the eye.     The remaining viscoelastic was aspirated.   Wounds were hydrated with balanced salt solution.  The anterior chamber was inflated to a physiologic pressure with balanced salt solution.   Intracameral vigamox 0.1 mL undiluted was injected into the eye and a drop placed onto the ocular surface.  No wound leaks were noted.  The patient was taken to the recovery room in stable condition without complications of anesthesia or surgery  Willey BladeBradley Tiki Tucciarone 03/04/2018, 8:17 AM

## 2018-03-04 NOTE — Anesthesia Postprocedure Evaluation (Addendum)
Anesthesia Post Note  Patient: Ruben Saxon T Brabson Sr.  Procedure(s) Performed: CATARACT EXTRACTION PHACO AND INTRAOCULAR LENS PLACEMENT (IOC) RIGHT DIABETIC (Right Eye)  Patient location during evaluation: PACU Anesthesia Type: MAC Level of consciousness: awake and alert Pain management: pain level controlled Vital Signs Assessment: post-procedure vital signs reviewed and stable Respiratory status: spontaneous breathing, nonlabored ventilation, respiratory function stable and patient connected to nasal cannula oxygen Cardiovascular status: stable and blood pressure returned to baseline Postop Assessment: no apparent nausea or vomiting Anesthetic complications: no    Veda Canning

## 2018-03-04 NOTE — Anesthesia Preprocedure Evaluation (Addendum)
Anesthesia Evaluation  Patient identified by MRN, date of birth, ID band Patient awake    Reviewed: Allergy & Precautions, H&P , NPO status , Patient's Chart, lab work & pertinent test results  Airway Mallampati: II  TM Distance: >3 FB Neck ROM: full    Dental no notable dental hx.    Pulmonary COPD, former smoker,    Pulmonary exam normal breath sounds clear to auscultation       Cardiovascular Exercise Tolerance: Good + Peripheral Vascular Disease   Rhythm:regular Rate:Normal  Hyperhomocysteinemia  Multiple vascular procedures including bypass  Leg prosthesis   Neuro/Psych negative neurological ROS  negative psych ROS   GI/Hepatic negative GI ROS, Neg liver ROS,   Endo/Other  diabetesHypothyroidism   Renal/GU negative Renal ROS  negative genitourinary   Musculoskeletal   Abdominal   Peds  Hematology negative hematology ROS (+)   Anesthesia Other Findings   Reproductive/Obstetrics negative OB ROS                             Anesthesia Physical  Anesthesia Plan  ASA: III  Anesthesia Plan: MAC   Post-op Pain Management:    Induction:   PONV Risk Score and Plan:   Airway Management Planned: Nasal Cannula  Additional Equipment:   Intra-op Plan:   Post-operative Plan:   Informed Consent: I have reviewed the patients History and Physical, chart, labs and discussed the procedure including the risks, benefits and alternatives for the proposed anesthesia with the patient or authorized representative who has indicated his/her understanding and acceptance.   Dental Advisory Given  Plan Discussed with: CRNA  Anesthesia Plan Comments:        Anesthesia Quick Evaluation

## 2018-03-04 NOTE — Transfer of Care (Signed)
Immediate Anesthesia Transfer of Care Note  Patient: Ruben MaduraWilliam T Latini Sr.  Procedure(s) Performed: CATARACT EXTRACTION PHACO AND INTRAOCULAR LENS PLACEMENT (IOC) RIGHT DIABETIC (Right Eye)  Patient Location: PACU  Anesthesia Type: General  Level of Consciousness: awake, alert  and patient cooperative  Airway and Oxygen Therapy: Patient Spontanous Breathing and Patient connected to supplemental oxygen  Post-op Assessment: Post-op Vital signs reviewed, Patient's Cardiovascular Status Stable, Respiratory Function Stable, Patent Airway and No signs of Nausea or vomiting  Post-op Vital Signs: Reviewed and stable  Complications: No apparent anesthesia complications

## 2018-03-05 ENCOUNTER — Encounter: Payer: Self-pay | Admitting: Ophthalmology

## 2018-07-30 ENCOUNTER — Other Ambulatory Visit
Admission: RE | Admit: 2018-07-30 | Discharge: 2018-07-30 | Disposition: A | Discharge status: Home / Self Care | Payer: Medicare Other | Source: Ambulatory Visit | Attending: Ophthalmology | Admitting: Ophthalmology

## 2018-07-30 DIAGNOSIS — H532 Diplopia: Secondary | ICD-10-CM | POA: Diagnosis present

## 2018-07-30 DIAGNOSIS — G7000 Myasthenia gravis without (acute) exacerbation: Secondary | ICD-10-CM | POA: Insufficient documentation

## 2018-07-30 LAB — CBC WITH DIFFERENTIAL/PLATELET
BASOS ABS: 0.1 10*3/uL (ref 0–0.1)
Basophils Relative: 1 %
Eosinophils Absolute: 0.3 10*3/uL (ref 0–0.7)
Eosinophils Relative: 6 %
HCT: 41.3 % (ref 40.0–52.0)
Hemoglobin: 14 g/dL (ref 13.0–18.0)
LYMPHS PCT: 21 %
Lymphs Abs: 1.2 10*3/uL (ref 1.0–3.6)
MCH: 31.8 pg (ref 26.0–34.0)
MCHC: 33.9 g/dL (ref 32.0–36.0)
MCV: 94 fL (ref 80.0–100.0)
Monocytes Absolute: 0.5 10*3/uL (ref 0.2–1.0)
Monocytes Relative: 9 %
NEUTROS ABS: 3.7 10*3/uL (ref 1.4–6.5)
Neutrophils Relative %: 63 %
Platelets: 203 10*3/uL (ref 150–440)
RBC: 4.39 MIL/uL — AB (ref 4.40–5.90)
RDW: 14 % (ref 11.5–14.5)
WBC: 5.9 10*3/uL (ref 3.8–10.6)

## 2018-07-30 LAB — SEDIMENTATION RATE: SED RATE: 32 mm/h — AB (ref 0–20)

## 2018-08-05 LAB — MISC LABCORP TEST (SEND OUT): Labcorp test code: 165592

## 2020-01-29 ENCOUNTER — Ambulatory Visit: Payer: Medicare Other | Attending: Internal Medicine

## 2020-01-29 DIAGNOSIS — Z23 Encounter for immunization: Secondary | ICD-10-CM

## 2020-01-29 NOTE — Progress Notes (Signed)
   Covid-19 Vaccination Clinic  Name:  Ruben HALBIG Sr.    MRN: [id number] DOB: 18-Feb-1941  01/29/2020  Ruben Wright was observed post Covid-19 immunization for 15 minutes without incident. He was provided with Vaccine Information Sheet and instruction to access the V-Safe system.   Ruben Wright was instructed to call 911 with any severe reactions post vaccine: Marland Kitchen Difficulty breathing  . Swelling of face and throat  . A fast heartbeat  . A bad rash all over body  . Dizziness and weakness   Immunizations Administered    Name Date Dose VIS Date Route   Pfizer COVID-19 Vaccine 01/29/2020  4:21 PM 0.3 mL 10/23/2019 Intramuscular   Manufacturer: [employer], [employer]   Lot: [id number]   NDC: [address]-7

## 2020-02-19 ENCOUNTER — Ambulatory Visit: Payer: Medicare Other | Attending: Internal Medicine

## 2020-02-19 DIAGNOSIS — Z23 Encounter for immunization: Secondary | ICD-10-CM

## 2020-02-19 NOTE — Progress Notes (Signed)
   Covid-19 Vaccination Clinic  Name:  Ruben BRAXTON Sr.    MRN: [id number] DOB: [birth year]  02/19/2020  Ruben Wright was observed post Covid-19 immunization for 15 minutes without incident. He was provided with Vaccine Information Sheet and instruction to access the V-Safe system.   Ruben Wright was instructed to call 911 with any severe reactions post vaccine: Marland Kitchen Difficulty breathing  . Swelling of face and throat  . A fast heartbeat  . A bad rash all over body  . Dizziness and weakness   Immunizations Administered    Name Date Dose VIS Date Route   Pfizer COVID-19 Vaccine 02/19/2020  3:54 PM 0.3 mL 10/23/2019 Intramuscular   Manufacturer: [employer], [employer]   Lot: [id number]   NDC: [address]-4

## 2021-06-24 ENCOUNTER — Other Ambulatory Visit: Payer: Self-pay

## 2021-06-24 ENCOUNTER — Ambulatory Visit
Admission: RE | Admit: 2021-06-24 | Discharge: 2021-06-24 | Disposition: A | Discharge status: Home / Self Care | Payer: Medicare Other | Source: Ambulatory Visit

## 2021-06-24 VITALS — BP 128/70 | HR 75 | Temp 98.2°F | Resp 18 | Ht 68.0 in | Wt 140.0 lb

## 2021-06-24 DIAGNOSIS — S61412A Laceration without foreign body of left hand, initial encounter: Secondary | ICD-10-CM

## 2021-06-24 MED ORDER — TETANUS-DIPHTHERIA TOXOIDS TD 5-2 LFU IM INJ
0.5000 mL | INJECTION | Freq: Once | INTRAMUSCULAR | Status: AC
  Administered 2021-06-24: 0.5 mL via INTRAMUSCULAR

## 2021-06-24 NOTE — Discharge Instructions (Addendum)
Leave the dressing that we applied in place for the next 24 hours.  You may remove the dressing if it gets soiled or wet and replace it with a new pressure dressing.  Do not remove the Surgicel that is underneath the dressing allow it to fall off on its own.  If your bleeding increases and you begin to saturate your dressing or it starts to flow from underneath the dressing you need go to the emergency department for evaluation as we have exhausted our means of achieving hemostasis in the urgent care.

## 2021-06-24 NOTE — ED Triage Notes (Signed)
Pt c/o skin tear to his left hand. Pt states he hit it on the underside of a table about 18hours ago, they have had a difficult time getting the bleeding to stop. Pt currently has a tight bandage on the hand.

## 2021-06-24 NOTE — ED Provider Notes (Signed)
[facility]    CSN: [id number] Arrival date & time: 06/24/21  1102      History   Chief Complaint Chief Complaint  Patient presents with   skin tear    Left hand    HPI Ruben PlumWilliam T Kho Sr. is a 80 y.o. male.   HPI  80 year old male here for evaluation of skin tear to left hand.  Patient reports that approximately 18 hours ago he went to answer his phone and he hit the back of his left hand on a countertop causing a skin tear.  He is on Coumadin for clotting disorder and states he was having a difficult time getting it to stop yesterday.  The did use an over-the-counter topical Bleedstop powder which helped to slow the bleeding down.  They applied a pressure dressing and opted not to go to the emergency department.  They are here today because he is continue to bleed from underneath his bandage.  Patient is unsure when his last tetanus shot was.  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   DM (diabetes mellitus), 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/2015   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   Disorder of subcutaneous tissue 04/25/2015   Angiopathy, 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   Cigarette  smoker 04/25/2015   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    Past Surgical History:  Procedure Laterality Date   ABDOMINAL AORTIC ENDOVASCULAR STENT GRAFT  1996   APPENDECTOMY     CAROTID-SUBCLAVIAN BYPASS GRAFT Left 2001   CATARACT EXTRACTION W/PHACO Left 12/03/2017   Procedure: CATARACT EXTRACTION PHACO AND INTRAOCULAR LENS PLACEMENT (IOC) LEFT DIABETIC;  Surgeon: Nevada CraneKing, Bradley Mark, MD;  [facility];  Service: Ophthalmology;  Laterality: Left;  Diabetic - oral meds   CATARACT EXTRACTION W/PHACO Right 03/04/2018   Procedure: CATARACT EXTRACTION PHACO AND INTRAOCULAR LENS PLACEMENT (IOC) RIGHT DIABETIC;  Surgeon: Nevada CraneKing, Bradley Mark, MD;  [facility];  Service: Ophthalmology;  Laterality: Right;   COLONOSCOPY     CORONARY ANGIOPLASTY WITH STENT PLACEMENT  1992, 2004   FEMORAL-POPLITEAL BYPASS GRAFT Left 1996   LEG AMPUTATION BELOW KNEE Left 2014       Home Medications    Prior to Admission medications   Medication Sig Start Date End Date Taking? Authorizing Provider  albuterol (VENTOLIN HFA) 108 (90 Base) MCG/ACT inhaler Inhale 2 puffs into the lungs 4 (four) times daily as needed. 02/05/11  Yes [provider]  azithromycin (ZITHROMAX) 250 MG tablet Take by mouth. 06/20/21  Yes [provider]  clopidogrel (PLAVIX) 75 MG tablet Take 75 mg by mouth daily. 02/18/21  Yes [provider]  ferrous gluconate (FERGON) 324 MG tablet Take 324 mg by  mouth daily. 06/27/17  Yes [provider]  Fluticasone-Umeclidin-Vilant 100-62.5-25 MCG/INH AEPB Inhale 25 mcg into the lungs daily.   Yes [provider]  levocetirizine (XYZAL) 5 MG tablet Take 1 tablet by mouth every evening. 10/21/20 10/21/21 Yes [provider]  levothyroxine (SYNTHROID) 75 MCG tablet Take by mouth. 09/23/19  Yes [provider]  metFORMIN (GLUCOPHAGE) 1000 MG tablet Take 1,000 mg by mouth 2 (two) times daily. 05/20/21  Yes [provider]  nitroGLYCERIN (NITROSTAT) 0.4 MG SL tablet Place under the tongue. 11/22/20 11/22/21 Yes [provider]  pantoprazole (PROTONIX) 40 MG tablet Take 40 mg by mouth daily. 04/09/21  Yes [provider]  predniSONE (DELTASONE) 20 MG tablet Take by mouth. 06/14/21  Yes [provider]  rosuvastatin (CRESTOR) 10 MG tablet Take 10 mg by mouth daily. 03/15/17 06/24/21 Yes [provider]  warfarin (COUMADIN) 2.5 MG tablet Take 2.5 mg by mouth. 02/09/20 06/08/22 Yes [provider]  warfarin (COUMADIN) 7.5 MG tablet Take 7.5 mg by mouth daily.   Yes [provider]  budesonide-formoterol (SYMBICORT) 160-4.5 MCG/ACT inhaler Inhale 2 puffs into the lungs 2 (two) times daily. 03/14/17 03/14/18  [provider]  glucose blood (ONE TOUCH ULTRA TEST) test strip  06/01/13   [provider]  ipratropium-albuterol (DUONEB) 0.5-2.5 (3) MG/3ML SOLN Take 3 mLs by nebulization as needed.    [provider]  roflumilast (DALIRESP) 500 MCG TABS tablet Take 500 mcg by mouth daily.    [provider]    Family History Family History  Problem Relation Age of Onset   CAD Mother    Hypothyroidism Mother    Cancer Father     Social History Social History   Tobacco Use   Smoking status: Former    Packs/day: 1.00    Years: 50.00    Pack years: 50.00    Types: Cigarettes    Quit date: 11/24/2015    Years since quitting: 5.5   Smokeless tobacco: Never  Vaping Use   Vaping Use: Never used  Substance Use Topics   Alcohol use: No    Alcohol/week: 0.0 standard drinks    Comment: may have 1 beer/month   Drug use: No     Allergies   Iodinated diagnostic agents, Penicillins, and Shellfish-derived products   Review of Systems Review of Systems  Skin:  Positive for color change and wound.  Neurological:  Negative for weakness and numbness.    Physical Exam Triage Vital Signs ED Triage Vitals [06/24/21 1121]  Enc  Vitals Group     BP      Pulse      Resp      Temp      Temp src      SpO2      Weight 140 lb (63.5 kg)     Height 5\' 8"  (1.727 m)     Head Circumference      Peak Flow      Pain Score 0     Pain Loc      Pain Edu?      Excl. in [place]?    No data found.  Updated Vital Signs BP 128/70 (BP Location: Left Arm)   Pulse 75   Temp 98.2 F (36.8 C) (Oral)   Resp 18   Ht 5\' 8"  (1.727 m)   Wt 140 lb (63.5 kg)   SpO2 96%   BMI 21.29 kg/m   Visual Acuity Right Eye Distance:  Left Eye Distance:   Bilateral Distance:    Right Eye Near:   Left Eye Near:    Bilateral Near:     Physical Exam Vitals and nursing note reviewed.  Constitutional:      General: He is not in acute distress.    Appearance: Normal appearance. He is normal weight. He is not ill-appearing.  Musculoskeletal:        General: Signs of injury present. Normal range of motion.  Skin:    General: Skin is warm and dry.     Capillary Refill: Capillary refill takes less than 2 seconds.     Findings: Bruising present.  Neurological:     General: No focal deficit present.     Mental Status: He is alert and oriented to person, place, and time.  Psychiatric:        Mood and Affect: Mood normal.        Behavior: Behavior normal.        Thought Content: Thought content normal.        Judgment: Judgment normal.     UC Treatments / Results  Labs (all labs ordered are listed, but only abnormal results are displayed) Labs Reviewed - No data to display  EKG   Radiology No results found.  Procedures Procedures (including critical care time)  Medications Ordered in UC Medications  tetanus & diphtheria toxoids (adult) (TENIVAC) injection 0.5 mL (0.5 mLs Intramuscular Given 06/24/21 1216)    Initial Impression / Assessment and Plan / UC Course  I have reviewed the triage vital signs and the nursing notes.  Pertinent labs & imaging results that were available during my care of the patient were reviewed by  me and considered in my medical decision making (see chart for details).  Patient is a very pleasant 80 year old male here for evaluation of skin tear to the back of his left hand that occurred about 18 hours ago after he struck the underside of a countertop while trying to answer his phone.  He has attempted direct pressure, pressure dressing, bleed stop powder, and ice without cessation of bleeding.  He states that the bleed stepdaughter did help slow the bleeding down and he was able to get sleep last night.  When him today he had a pressure dressing held in place with Coban and it was bleeding from underneath the dressing.  Dressing was removed using wound skin cleanser and gentle coaxing of the bandage from the top of the wound.  Patient has a 3 cm long tissue defect skin tear with 2 areas of continuous venous oozing.  Attempted to achieve hemostasis with direct pressure for 5 minutes without resolution of bleeding.  I then attempted to apply Surgicel to arrest the bleeding which was also unsuccessful.  Final attempt to rest the bleeding involved the use of 4 silver nitrate sticks to cauterize the wound which resulted in a slowing of the bleeding but there is still areas of venous oozing.  A third piece of Surgicel was applied over top and a pressure dressing with a nonadherent dressing was applied and the patient's arm was elevated.  Will update tetanus shot and monitor the patient.  If the bleeding has stopped with the pressure dressing, Surgicel, and silver nitrate will discharge patient home to follow-up with his primary care provider but if he is continuing to bleed I will send the patient to the emergency department as we have exhausted our measures to achieve hemostasis.  Patient has had a few  small areas of venous ooze erupt at peripheries of the dressing but there is no oozing from underneath the dressing and the dressing is not saturated.  I will discharge patient home to monitor for any worsening  bleeding.  I have encouraged him to keep his left hand elevated and to apply ice to the back of his hand to help cause capillary constriction.  Patient vies that if his bleeding returns and worsens that he needs to go to the ER for evaluation given his anticoagulant status as we have exhausted the measures we have available to [place] to stop the bleeding here in the urgent care.   Final Clinical Impressions(s) / UC Diagnoses   Final diagnoses:  Skin tear of hand without complication, left, initial encounter     Discharge Instructions      Leave the dressing that we applied in place for the next 24 hours.  You may remove the dressing if it gets soiled or wet and replace it with a new pressure dressing.  Do not remove the Surgicel that is underneath the dressing allow it to fall off on its own.  If your bleeding increases and you begin to saturate your dressing or it starts to flow from underneath the dressing you need go to the emergency department for evaluation as we have exhausted our means of achieving hemostasis in the urgent care.     ED Prescriptions   None    PDMP not reviewed this encounter.   Ruben [place], NP 06/24/21 1230

## 2022-11-18 ENCOUNTER — Ambulatory Visit (INDEPENDENT_AMBULATORY_CARE_PROVIDER_SITE_OTHER): Payer: Medicare Other

## 2022-11-18 ENCOUNTER — Encounter: Payer: Self-pay | Admitting: Emergency Medicine

## 2022-11-18 ENCOUNTER — Ambulatory Visit
Admission: EM | Admit: 2022-11-18 | Discharge: 2022-11-18 | Disposition: A | Discharge status: Home / Self Care | Payer: Medicare Other | Attending: Physician Assistant | Admitting: Physician Assistant

## 2022-11-18 DIAGNOSIS — R079 Chest pain, unspecified: Secondary | ICD-10-CM | POA: Diagnosis not present

## 2022-11-18 DIAGNOSIS — W19XXXA Unspecified fall, initial encounter: Secondary | ICD-10-CM

## 2022-11-18 DIAGNOSIS — R0781 Pleurodynia: Secondary | ICD-10-CM

## 2022-11-18 DIAGNOSIS — M79632 Pain in left forearm: Secondary | ICD-10-CM

## 2022-11-18 DIAGNOSIS — R109 Unspecified abdominal pain: Secondary | ICD-10-CM | POA: Diagnosis not present

## 2022-11-18 DIAGNOSIS — R296 Repeated falls: Secondary | ICD-10-CM

## 2022-11-18 DIAGNOSIS — R1900 Intra-abdominal and pelvic swelling, mass and lump, unspecified site: Secondary | ICD-10-CM

## 2022-11-18 DIAGNOSIS — T07XXXA Unspecified multiple injuries, initial encounter: Secondary | ICD-10-CM

## 2022-11-18 MED ORDER — HYDROCODONE-ACETAMINOPHEN 5-325 MG PO TABS: 1.0000 | ORAL_TABLET | Freq: Three times a day (TID) | ORAL | 0 refills | Status: AC | PRN

## 2022-11-18 NOTE — Discharge Instructions (Addendum)
-  No fractures on x-rays. You also don't have any signs of abdominal injury (bleeding). You should be applying ice to the areas of swelling and bruising. No more NSAIDs. I have sent pain meds if needed for severe pain. Be very careful at night so you don't continue to have falls. If you hit your head while on a blood thinner you could have a bleed. Also you need to follow up with doctor and get your INRs under control. Make follow up with PCP this week or next.ER if any acute worsening of pain or swelling.

## 2022-11-18 NOTE — ED Triage Notes (Signed)
Patient states that he has fallen 3 times this month.  Patient's last fall was 3 nights ago.  Patient has tender knot on the left side of his abdomen.  Patient also c/o left shoulder pain.  Patient also had left hand pain but has that xrayed and negative for fracture.

## 2022-11-18 NOTE — ED Provider Notes (Signed)
[facility]    CSN: [id number] Arrival date & time: 11/18/22  1456      History   Chief Complaint Chief Complaint  Patient presents with   Fall   Shoulder Pain   Hand Pain    HPI Ruben Messman Predmore Sr. is a 82 y.o. 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  Patient says he has had 3 different falls over the past month.  States that when he gets up in the middle of the night to go to the bathroom he does not put on his prosthetic leg and hops over to the bathroom.  He says that he try to use a walker 3 days ago but ended up falling onto his left side.  He reports bruising of the left forearm and biceps.  Patient also reports some pain in the left ribs and left abdomen.  Swelling present over the left lower abdomen.  Patient does have history of long-term anticoagulant use and his INR has been elevated.  He has been taking NSAIDs as well for pain since he has had pain in the left hand and shoulder after his previous fall.  Patient had x-rays performed of the left hand and shoulder previously and no fractures.  He has been wearing a brace on left hand.  This time he is most concerned about the pain in his ribs and swelling of the abdomen.  No report of any bleeding in urine or dark stools, chest pain, shortness of breath, vomiting or diarrhea.  Normal appetite.  Never had any loss of consciousness and has not been confused or weak.  No other concerns.  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 04/25/2015   History of anticoagulant therapy 04/25/2015   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   DM (diabetes mellitus), 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/2015   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   Disorder of subcutaneous tissue 04/25/2015   Angiopathy, 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   Cigarette smoker 04/25/2015   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    Past Surgical History:  Procedure Laterality Date   ABDOMINAL AORTIC ENDOVASCULAR STENT GRAFT  1996   APPENDECTOMY     CAROTID-SUBCLAVIAN BYPASS GRAFT Left 2001   CATARACT EXTRACTION W/PHACO Left 12/03/2017   Procedure: CATARACT EXTRACTION PHACO AND INTRAOCULAR LENS PLACEMENT (IOC) LEFT DIABETIC;  Surgeon: Nevada Crane, MD;  Location: [facility];  Service: Ophthalmology;  Laterality: Left;  Diabetic - oral meds   CATARACT EXTRACTION W/PHACO Right 03/04/2018   Procedure: CATARACT EXTRACTION PHACO AND INTRAOCULAR LENS PLACEMENT (IOC) RIGHT DIABETIC;  Surgeon: Nevada Crane, MD;  Location: [facility];  Service: Ophthalmology;  Laterality: Right;   COLONOSCOPY     CORONARY ANGIOPLASTY WITH STENT PLACEMENT  1992, 2004   FEMORAL-POPLITEAL BYPASS GRAFT Left 1996   LEG AMPUTATION BELOW KNEE Left 2014       Home Medications    Prior to Admission medications   Medication Sig Start Date End Date Taking? Authorizing Provider  HYDROcodone-acetaminophen (NORCO/VICODIN) 5-325 MG tablet Take 1 tablet by mouth every 8 (eight) hours as needed for up to 5 days for severe pain. 11/18/22 11/23/22 Yes Shirlee Latch, PA-C  albuterol (VENTOLIN HFA) 108 (90 Base) MCG/ACT inhaler Inhale 2 puffs into the lungs 4 (four) times daily as needed. 02/05/11   [provider]   azithromycin (ZITHROMAX) 250 MG tablet Take by mouth. 06/20/21   [provider]  budesonide-formoterol (SYMBICORT) 160-4.5 MCG/ACT inhaler Inhale 2 puffs into the lungs 2 (two) times daily. 03/14/17 03/14/18  [provider]  clopidogrel (PLAVIX) 75 MG tablet Take 75 mg by mouth daily. 02/18/21   [provider]  ferrous gluconate (FERGON) 324 MG tablet Take 324 mg by mouth daily. 06/27/17   [provider]  Fluticasone-Umeclidin-Vilant 100-62.5-25 MCG/INH AEPB Inhale 25 mcg into the lungs daily.    [provider]  glucose blood (ONE TOUCH ULTRA TEST) test strip  06/01/13   [provider]  ipratropium-albuterol (DUONEB) 0.5-2.5 (3) MG/3ML SOLN Take 3 mLs by nebulization as needed.    [provider]  levocetirizine (XYZAL) 5 MG tablet Take 1 tablet by mouth every evening. 10/21/20 10/21/21  [provider]  levothyroxine (SYNTHROID) 75 MCG tablet Take by mouth. 09/23/19   [provider]  metFORMIN (GLUCOPHAGE) 1000 MG tablet Take 1,000 mg by mouth 2 (two) times daily. 05/20/21   [provider]  nitroGLYCERIN (NITROSTAT) 0.4 MG SL tablet Place under the tongue. 11/22/20 11/22/21  [provider]  pantoprazole (PROTONIX) 40 MG tablet Take 40 mg by mouth daily. 04/09/21   [provider]  predniSONE (DELTASONE) 20 MG tablet Take by mouth. 06/14/21   [provider]  roflumilast (DALIRESP) 500 MCG TABS tablet Take 500 mcg by mouth daily.    [provider]  rosuvastatin (CRESTOR) 10 MG tablet Take 10 mg by mouth daily. 03/15/17 06/24/21  [provider]  warfarin (COUMADIN) 7.5 MG tablet Take 7.5 mg by mouth daily.    [provider]    Family History Family History  Problem Relation Age of Onset   CAD Mother    Hypothyroidism Mother    Cancer Father     Social History Social History   Tobacco Use   Smoking status: Former    Packs/day: 1.00    Years: 50.00     Total pack years: 50.00    Types: Cigarettes    Quit date: 11/24/2015    Years since quitting: 6.9   Smokeless tobacco: Never  Vaping Use   Vaping Use: Never used  Substance Use Topics   Alcohol use: No    Alcohol/week: 0.0 standard drinks of alcohol    Comment: may have 1 beer/month   Drug use: No     Allergies   Iodinated contrast media, Penicillins, and Shellfish-derived products   Review of Systems Review of Systems  Constitutional:  Negative for fatigue.  Respiratory:  Negative for shortness of breath.   Cardiovascular:  Negative for chest pain.  Gastrointestinal:  Positive for abdominal pain. Negative for diarrhea, nausea and vomiting.  Genitourinary:  Negative for flank pain.  Musculoskeletal:  Positive for arthralgias. Negative for back pain and joint swelling.  Skin:  Positive for color change.  Neurological:  Negative for dizziness, syncope, weakness and numbness.  Psychiatric/Behavioral:  Negative for confusion.      Physical Exam Triage Vital Signs ED Triage Vitals  Enc Vitals Group     BP 11/18/22 1534 (!) 104/50     Pulse Rate 11/18/22 1534 81     Resp 11/18/22 1534 16     Temp 11/18/22 1534 98.7 F (37.1 C)     Temp Source 11/18/22 1534 Oral     SpO2 11/18/22 1534 92 %     Weight 11/18/22 1533 139 lb 15.9 oz (63.5 kg)     Height 11/18/22 1533 5\' 8"  (1.727 m)     Head Circumference --      Peak Flow --      Pain Score 11/18/22 1533 8     Pain Loc --      Pain Edu? --      Excl. in [place]? --    No data found.  Updated Vital Signs BP (!) 104/50 (BP Location: Left Arm)   Pulse 81   Temp 98.7 F (37.1 C) (Oral)   Resp 16   Ht 5\' 8"  (1.727 m)   Wt 139 lb 15.9 oz (63.5 kg)   SpO2 92% Comment: has COPD  BMI 21.29 kg/m   Physical Exam Vitals and nursing note reviewed.  Constitutional:      General: He is not in acute distress.    Appearance: Normal appearance. He is well-developed. He is not ill-appearing.  HENT:     Head: Normocephalic and  atraumatic.     Nose: Nose normal.     Mouth/Throat:     Mouth: Mucous membranes are moist.     Pharynx: Oropharynx is clear.  Eyes:     General: No scleral icterus.    Conjunctiva/sclera: Conjunctivae normal.  Cardiovascular:     Rate and Rhythm: Normal rate and regular rhythm.     Heart sounds: Normal heart sounds.  Pulmonary:     Effort: Pulmonary effort is normal. No respiratory distress.     Breath sounds: Normal breath sounds.     Comments: Mild tenderness palpation of the left lateral ribs. Abdominal:     Palpations: Abdomen is soft.     Tenderness: There is abdominal tenderness.     Comments: There is an area of superficial soft tissue swelling of the left lower abdomen which is mildly TTP.  Musculoskeletal:     Cervical back: Neck supple.     Comments: LEFT ARM: Small contusion over the left biceps with mild tenderness.  Contusion of left ventral forearm and tenderness throughout forearm.  Full range of motion of the elbow and wrist seemingly without pain.  Good pulses and strength.  Skin:    General: Skin is warm and dry.     Capillary Refill: Capillary refill takes less than 2 seconds.  Neurological:     General: No focal deficit present.     Mental Status: He is alert and oriented to person, place, and time. Mental status is at baseline.     Cranial Nerves: No cranial nerve deficit.     Motor: No weakness.     Gait: Gait abnormal (has prosthetic leg, wearing today.).  Psychiatric:        Mood and Affect: Mood normal.      UC Treatments / Results  Labs (all labs ordered are listed, but only abnormal results are displayed) Labs Reviewed - No data to display  EKG   Radiology No  results found.  Procedures Procedures (including critical care time)  Medications Ordered in UC Medications - No data to display  Initial Impression / Assessment and Plan / UC Course  I have reviewed the triage vital signs and the nursing notes.  Pertinent labs & imaging  results that were available during my care of the patient were reviewed by me and considered in my medical decision making (see chart for details).  82 year old male with history of left lower extremity amputation below the knee and chronic anticoagulant use presents for accidental fall that occurred 3 days previous.  Fall happened accidentally when he was trying to walk without his prosthetic.  This is his third fall in the last month.  He previously injured his left arm and has had some images performed which have been without evidence of fracture.  He is complaining of left forearm pain, left rib pain and swelling and discomfort of the left lower abdomen.  He denies head injury or loss of consciousness.  Has been taking Tylenol for pain without relief.   PE: Bruising left bicep and forearm. TTP entire shoulder, bruise on bicep, left ribs and left lower abdomen TTP mildly.  Chest clear auscultation heart regular rate and rhythm.  Normal cranial nerve exam.  X-ray performed of left forearm, left ribs and chest as well as KUB.  All imaging without evidence of acute injury.  Discussed results with patient.  Minor superficial injuries.  Since patient is on anticoagulants, will treat with Ultram as needed for severe pain and reviewed RICE guidelines. Needs to follow up with PCP. Reviewed return and ED precautions.   Final Clinical Impressions(s) / UC Diagnoses   Final diagnoses:  Rib pain on left side  Abdominal swelling  Left forearm pain  Frequent falls  Multiple contusions     Discharge Instructions      -No fractures on x-rays. You also don't have any signs of abdominal injury (bleeding). You should be applying ice to the areas of swelling and bruising. No more NSAIDs. I have sent pain meds if needed for severe pain. Be very careful at night so you don't continue to have falls. If you hit your head while on a blood thinner you could have a bleed. Also you need to follow up with doctor and  get your INRs under control. Make follow up with PCP this week or next.ER if any acute worsening of pain or swelling.     ED Prescriptions     Medication Sig Dispense Auth. Provider   HYDROcodone-acetaminophen (NORCO/VICODIN) 5-325 MG tablet Take 1 tablet by mouth every 8 (eight) hours as needed for up to 5 days for severe pain. 12 tablet Shirlee Latch, PA-C      I have reviewed the PDMP during this encounter.   Shirlee Latch, PA-C 11/21/22 [phone]

## 2022-11-21 DIAGNOSIS — R1900 Intra-abdominal and pelvic swelling, mass and lump, unspecified site: Secondary | ICD-10-CM | POA: Diagnosis not present

## 2022-11-21 DIAGNOSIS — M79632 Pain in left forearm: Secondary | ICD-10-CM

## 2022-11-21 DIAGNOSIS — R296 Repeated falls: Secondary | ICD-10-CM

## 2022-11-21 DIAGNOSIS — T07XXXA Unspecified multiple injuries, initial encounter: Secondary | ICD-10-CM

## 2022-11-21 DIAGNOSIS — R0781 Pleurodynia: Secondary | ICD-10-CM

## 2023-09-25 ENCOUNTER — Encounter: Payer: Self-pay | Admitting: Emergency Medicine

## 2023-09-25 ENCOUNTER — Ambulatory Visit: Payer: Medicare Other

## 2023-09-25 ENCOUNTER — Ambulatory Visit
Admission: EM | Admit: 2023-09-25 | Discharge: 2023-09-25 | Disposition: A | Discharge status: Home / Self Care | Payer: Medicare Other | Attending: Family Medicine | Admitting: Family Medicine

## 2023-09-25 DIAGNOSIS — Z7901 Long term (current) use of anticoagulants: Secondary | ICD-10-CM | POA: Diagnosis not present

## 2023-09-25 DIAGNOSIS — J441 Chronic obstructive pulmonary disease with (acute) exacerbation: Secondary | ICD-10-CM | POA: Diagnosis not present

## 2023-09-25 MED ORDER — PREDNISONE 50 MG PO TABS
50.0000 mg | ORAL_TABLET | Freq: Every day | ORAL | 0 refills | Status: AC
Start: 2023-09-25 — End: 2023-09-30

## 2023-09-25 MED ORDER — CEFDINIR 300 MG PO CAPS: 300.0000 mg | ORAL_CAPSULE | Freq: Two times a day (BID) | ORAL | 0 refills | Status: DC

## 2023-09-25 MED ORDER — ALBUTEROL SULFATE HFA 108 (90 BASE) MCG/ACT IN AERS: 2.0000 | INHALATION_SPRAY | Freq: Four times a day (QID) | RESPIRATORY_TRACT | 2 refills | Status: AC | PRN

## 2023-09-25 MED ORDER — HYDROCOD POLI-CHLORPHE POLI ER 10-8 MG/5ML PO SUER: 5.0000 mL | Freq: Two times a day (BID) | ORAL | 0 refills | Status: DC | PRN

## 2023-09-25 NOTE — ED Triage Notes (Signed)
Pt presents with cough x 3-4 weeks. Pt has tried OTC cold medication and he has a history of COPD.

## 2023-09-25 NOTE — ED Provider Notes (Signed)
[facility]    CSN: [id number] Arrival date & time: 09/25/23  1442      History   Chief Complaint Chief Complaint  Patient presents with   Cough    HPI Ruben SCHERGER Sr. is a 82 y.o. male.   HPI  History obtained from the patient and his daughter  Ruben Wright presents for productive cough for the past 4 weeks. Cough has worsened in the past 3 days. Has yellow sputum. Last night, he only slept an hour. No fever,  sore throat. Has rhinorrhea. Taking Nyquil and elderberry.   Has COPD and quit smoking in 2017.  He smoked for 50+ years. Uses trelegy.      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   DM (diabetes mellitus), 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/2015   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   Disorder of subcutaneous tissue 04/25/2015   Angiopathy, 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   Cigarette smoker 04/25/2015   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    Past Surgical History:  Procedure Laterality Date   ABDOMINAL AORTIC ENDOVASCULAR STENT GRAFT  1996   APPENDECTOMY     CAROTID-SUBCLAVIAN BYPASS GRAFT Left 2001   CATARACT EXTRACTION W/PHACO Left 12/03/2017   Procedure: CATARACT  EXTRACTION PHACO AND INTRAOCULAR LENS PLACEMENT (IOC) LEFT DIABETIC;  Surgeon: Nevada Crane, MD;  Location: [facility];  Service: Ophthalmology;  Laterality: Left;  Diabetic - oral meds   CATARACT EXTRACTION W/PHACO Right 03/04/2018   Procedure: CATARACT EXTRACTION PHACO AND INTRAOCULAR LENS PLACEMENT (IOC) RIGHT DIABETIC;  Surgeon: Nevada Crane, MD;  Location: [facility];  Service: Ophthalmology;  Laterality: Right;   COLONOSCOPY     CORONARY ANGIOPLASTY WITH STENT PLACEMENT  1992, 2004   FEMORAL-POPLITEAL BYPASS GRAFT Left 1996   LEG AMPUTATION BELOW KNEE Left 2014       Home Medications    Prior to Admission medications   Medication Sig Start Date End Date Taking? Authorizing Provider  cefdinir (OMNICEF) 300 MG capsule Take 1 capsule (300 mg total) by mouth 2 (two) times daily. 09/25/23  Yes Daquann Merriott, DO  chlorpheniramine-HYDROcodone (TUSSIONEX) 10-8 MG/5ML Take 5 mLs by mouth every 12 (twelve) hours as needed. 09/25/23  Yes Jacquis Paxton, Seward Meth, DO  clopidogrel (PLAVIX) 75 MG tablet Take 75 mg by mouth daily. 02/18/21  Yes [provider]  ferrous gluconate (FERGON) 324 MG tablet Take 324 mg by mouth daily. 06/27/17  Yes [provider]  Fluticasone-Umeclidin-Vilant 100-62.5-25 MCG/INH AEPB Inhale 25 mcg into the lungs daily.   Yes [provider]  glipiZIDE (GLUCOTROL XL) 5 MG 24 hr tablet Take 5 mg by mouth daily.   Yes [provider]  levothyroxine (SYNTHROID) 75  MCG tablet Take by mouth. 09/23/19  Yes [provider]  metFORMIN (GLUCOPHAGE) 1000 MG tablet Take 1,000 mg by mouth 2 (two) times daily. 05/20/21  Yes [provider]  nitroGLYCERIN (NITROSTAT) 0.4 MG SL tablet Place under the tongue. 11/22/20 09/25/23 Yes [provider]  predniSONE (DELTASONE) 50 MG tablet Take 1 tablet (50 mg total) by mouth daily for 5 days. 09/25/23 09/30/23 Yes Alexiz Sustaita, DO  rosuvastatin (CRESTOR) 10 MG  tablet Take 10 mg by mouth daily. 03/15/17 09/25/23 Yes [provider]  warfarin (COUMADIN) 7.5 MG tablet Take 7.5 mg by mouth daily.   Yes [provider]  albuterol (VENTOLIN HFA) 108 (90 Base) MCG/ACT inhaler Inhale 2 puffs into the lungs 4 (four) times daily as needed. 09/25/23   Emad Brechtel, Seward Meth, DO  azithromycin (ZITHROMAX) 250 MG tablet Take by mouth. 06/20/21   [provider]  glucose blood (ONE TOUCH ULTRA TEST) test strip  06/01/13   [provider]  ipratropium-albuterol (DUONEB) 0.5-2.5 (3) MG/3ML SOLN Take 3 mLs by nebulization as needed.    [provider]  levocetirizine (XYZAL) 5 MG tablet Take 1 tablet by mouth every evening. 10/21/20 10/21/21  [provider]  pantoprazole (PROTONIX) 40 MG tablet Take 40 mg by mouth daily. 04/09/21   [provider]  roflumilast (DALIRESP) 500 MCG TABS tablet Take 500 mcg by mouth daily.    [provider]    Family History Family History  Problem Relation Age of Onset   CAD Mother    Hypothyroidism Mother    Cancer Father     Social History Social History   Tobacco Use   Smoking status: Former    Current packs/day: 0.00    Average packs/day: 1 pack/day for 50.0 years (50.0 ttl pk-yrs)    Types: Cigarettes    Start date: 11/23/1965    Quit date: 11/24/2015    Years since quitting: 7.8   Smokeless tobacco: Never  Vaping Use   Vaping status: Never Used  Substance Use Topics   Alcohol use: No    Alcohol/week: 0.0 standard drinks of alcohol    Comment: may have 1 beer/month   Drug use: No     Allergies   Iodinated contrast media, Penicillins, and Shellfish-derived products   Review of Systems Review of Systems: negative unless otherwise stated in HPI.      Physical Exam Triage Vital Signs ED Triage Vitals  Encounter Vitals Group     BP 09/25/23 1512 111/69     Systolic BP Percentile --      Diastolic BP Percentile --      Pulse Rate 09/25/23 1512 87      Resp 09/25/23 1512 18     Temp 09/25/23 1512 97.9 F (36.6 C)     Temp Source 09/25/23 1512 Oral     SpO2 09/25/23 1512 91 %     Weight --      Height --      Head Circumference --      Peak Flow --      Pain Score 09/25/23 1509 0     Pain Loc --      Pain Education --      Exclude from Growth Chart --    No data found.  Updated Vital Signs BP 111/69 (BP Location: Left Arm)   Pulse 87   Temp 97.9 F (36.6 C) (Oral)   Resp 18   SpO2 91%   Visual Acuity Right Eye  Distance:   Left Eye Distance:   Bilateral Distance:    Right Eye Near:   Left Eye Near:    Bilateral Near:     Physical Exam GEN:     alert, non-toxic appearing elderly male in no distress    HENT:  mucus membranes moist, no nasal discharge EYES:   no scleral injection or discharge RESP:  no increased work of breathing, coarse breathe sounds, decreased air movement bilaterally  CVS:   regular rate and rhythm Skin:   warm and dry    UC Treatments / Results  Labs (all labs ordered are listed, but only abnormal results are displayed) Labs Reviewed - No data to display  EKG   Radiology DG Chest 2 View  Result Date: 09/25/2023 CLINICAL DATA:  Cough for 4 weeks. EXAM: CHEST - 2 VIEW COMPARISON:  November 18, 2022. FINDINGS: The heart size and mediastinal contours are within normal limits. Hyperexpansion of the lungs is noted. Calcified granuloma is seen in left upper lobe. No acute pulmonary disease is noted. The visualized skeletal structures are unremarkable. IMPRESSION: No active cardiopulmonary disease. Electronically Signed   By: Lupita Raider M.D.   On: 09/25/2023 17:27    Procedures Procedures (including critical care time)  Medications Ordered in UC Medications - No data to display  Initial Impression / Assessment and Plan / UC Course  I have reviewed the triage vital signs and the nursing notes.  Pertinent labs & imaging results that were available during my care of the patient were  reviewed by me and considered in my medical decision making (see chart for details).       Pt is a 82 y.o. male with history of COPD who presents for 4 weeks of cough that is not improving.  Juden is  afebrile here without recent antipyretics. Satting 91%  on room air. Overall pt is  non-toxic appearing, well hydrated, without respiratory distress. Pulmonary exam is remarkable fo coarse breathe sounds, decreased air movement bilaterally  and cough.  After shared decision making, we will pursue chest x-ray. COVID  and influenza testing deferred due to length of symptoms.   Chest xray personally reviewed by me without focal pneumonia, pleural effusion, cardiomegaly or pneumothorax. Patient aware the radiologist has not read his xray and is comfortable with the preliminary read by me. Will review radiologist read when available and call patient if a change in plan is warranted.  Pt agreeable to this plan prior to discharge.   Treat acute COPD exacerbation with steroids and antibiotics as below. Tussionex cough syrup given for cough and allow patient to rest.  Typical duration of symptoms discussed.  INR is supratherapeutic at 7.8 therefore unable to give doxycycline, azithromycin or Levaquin safely. They are able to check at home. Has vitamin K shakes to bring down his INR.  Has follow up scheduled to discuss with outside provider.    Return and ED precautions given and patient voiced understanding. Discussed MDM, treatment plan and plan for follow-up with patient who agrees with plan.    Final Clinical Impressions(s) / UC Diagnoses   Final diagnoses:  COPD exacerbation (HCC)  Chronic anticoagulation     Discharge Instructions      Stop by the pharmacy to pick up your prescriptions.  Follow up with your primary care provider or lung doctor.   Your chest x-ray does not show pneumonia however the radiologist has not officially read it.  If they see a pneumonia that I did  not see, I will  call you.         ED Prescriptions     Medication Sig Dispense Auth. Provider   albuterol (VENTOLIN HFA) 108 (90 Base) MCG/ACT inhaler Inhale 2 puffs into the lungs 4 (four) times daily as needed. 6.7 g Kuper Rennels, DO   predniSONE (DELTASONE) 50 MG tablet Take 1 tablet (50 mg total) by mouth daily for 5 days. 5 tablet Dontell Mian, DO   cefdinir (OMNICEF) 300 MG capsule Take 1 capsule (300 mg total) by mouth 2 (two) times daily. 14 capsule Kaydence Menard, DO   chlorpheniramine-HYDROcodone (TUSSIONEX) 10-8 MG/5ML Take 5 mLs by mouth every 12 (twelve) hours as needed. 115 mL Rayden Dock, Seward Meth, DO      I have reviewed the PDMP during this encounter.   Katha Cabal, DO 09/27/23 1011

## 2023-09-25 NOTE — Discharge Instructions (Signed)
Stop by the pharmacy to pick up your prescriptions.  Follow up with your primary care provider or lung doctor.   Your chest x-ray does not show pneumonia however the radiologist has not officially read it.  If they see a pneumonia that I did not see, I will call you.

## 2023-10-05 ENCOUNTER — Ambulatory Visit
Admission: EM | Admit: 2023-10-05 | Discharge: 2023-10-05 | Disposition: A | Discharge status: Home / Self Care | Payer: Medicare Other

## 2023-10-05 DIAGNOSIS — Z9229 Personal history of other drug therapy: Secondary | ICD-10-CM

## 2023-10-05 DIAGNOSIS — S0093XA Contusion of unspecified part of head, initial encounter: Secondary | ICD-10-CM

## 2023-10-05 NOTE — ED Provider Notes (Signed)
Patient has a left forehead head trauma he tripped and fell into the door door jam.  There is an abrasion with loose skin and notable swelling and bruising at this time.  He is currently on Coumadin and clopidogrel.  He denies any loss of consciousness.  He is referred to the ER as he is a high risk for internal brain bleed.  He will need a CT.  Family is with him at this time we will cover the wound apply ice pack and family is agreeable to driving him to [facility].  Offered EMS patient and family declined.   Nelda Marseille, NP 10/05/23 [phone]

## 2023-10-05 NOTE — ED Notes (Signed)
Patient is being discharged from the Urgent Care and sent to the Emergency Department via personal vehicle . Per Eunice Blase, NP, patient is in need of higher level of care due to Fall with head injury. Patient is aware and verbalizes understanding of plan of care.  Vitals:   10/05/23 1511  BP: 108/67  Pulse: 86  SpO2: 92%

## 2023-12-11 ENCOUNTER — Ambulatory Visit
Admission: RE | Admit: 2023-12-11 | Discharge: 2023-12-11 | Disposition: A | Discharge status: Home / Self Care | Payer: Medicare Other | Source: Ambulatory Visit | Attending: Family Medicine | Admitting: Family Medicine

## 2023-12-11 ENCOUNTER — Ambulatory Visit (INDEPENDENT_AMBULATORY_CARE_PROVIDER_SITE_OTHER): Payer: Medicare Other

## 2023-12-11 VITALS — BP 126/75 | HR 90 | Temp 99.5°F | Resp 16 | Ht 68.0 in | Wt 134.0 lb

## 2023-12-11 DIAGNOSIS — R509 Fever, unspecified: Secondary | ICD-10-CM | POA: Diagnosis not present

## 2023-12-11 DIAGNOSIS — J989 Respiratory disorder, unspecified: Secondary | ICD-10-CM | POA: Insufficient documentation

## 2023-12-11 DIAGNOSIS — J101 Influenza due to other identified influenza virus with other respiratory manifestations: Secondary | ICD-10-CM | POA: Insufficient documentation

## 2023-12-11 LAB — RESP PANEL BY RT-PCR (RSV, FLU A&B, COVID)  RVPGX2
Influenza A by PCR: POSITIVE — AB
Influenza B by PCR: NEGATIVE
Resp Syncytial Virus by PCR: NEGATIVE
SARS Coronavirus 2 by RT PCR: NEGATIVE

## 2023-12-11 MED ORDER — HYDROCOD POLI-CHLORPHE POLI ER 10-8 MG/5ML PO SUER: 5.0000 mL | Freq: Two times a day (BID) | ORAL | 0 refills | Status: AC | PRN

## 2023-12-11 MED ORDER — OSELTAMIVIR PHOSPHATE 75 MG PO CAPS: 75.0000 mg | ORAL_CAPSULE | Freq: Two times a day (BID) | ORAL | 0 refills | Status: AC

## 2023-12-11 MED ORDER — ONDANSETRON HCL 4 MG PO TABS: 4.0000 mg | ORAL_TABLET | Freq: Four times a day (QID) | ORAL | 0 refills | Status: AC

## 2023-12-11 MED ORDER — PREDNISONE 50 MG PO TABS: 50.0000 mg | ORAL_TABLET | Freq: Every day | ORAL | 0 refills | Status: AC

## 2023-12-11 NOTE — Discharge Instructions (Addendum)
You have influenza A.  Tamiflu, a cough syrup and steroids were prescribed.  Your symptoms will gradually improve over the next 7 to 10 days.  The cough may last about 3 weeks.   Take Tylenol 1000 mg for fever, headache or body aches.   For cough:  Stop by the the pharmacy to pick up your prescription cough medication.You can use a humidifier for chest congestion and cough.  If you don't have a humidifier, you can sit in the bathroom with the hot shower running.      For sore throat: try warm salt water gargles, Mucinex sore throat cough drops or cepacol lozenges, throat spray, warm tea or water with lemon/honey, popsicles or ice, or OTC cold relief medicine for throat discomfort. You can also purchase chloraseptic spray at the pharmacy or dollar store.   For congestion: take a daily anti-histamine like Zyrtec, Claritin, and a oral decongestant, such as pseudoephedrine.  You can also use Flonase 1-2 sprays in each nostril daily. Afrin is also a good option, if you do not have high blood pressure.    It is important to stay hydrated: drink plenty of fluids (water, gatorade/powerade/pedialyte, juices, or teas) to keep your throat moisturized and help further relieve irritation/discomfort.    Return or go to the Emergency Department if symptoms worsen or do not improve in the next few days

## 2023-12-11 NOTE — ED Triage Notes (Addendum)
Pt c/o cough,fever,sob & fatigue x3 days. Tmax 102 last night. Had PNA 1 mon ago. Hx of COPD. Has tried tylenol & alka seltzer w/o relief.

## 2023-12-11 NOTE — ED Provider Notes (Addendum)
[facility]    CSN: [id number] Arrival date & time: 12/11/23  1357      History   Chief Complaint Chief Complaint  Patient presents with   Cough    Appt   Fever   Fatigue   Shortness of Breath    HPI Ruben GIRTMAN Sr. is a 83 y.o. male.   HPI  History obtained from daughter. Kule presents for  cough, rhinorrhea, shortness of breath, weakness and nasal congestion. Symptoms started 5 days ago. Has productive cough.  Started having fevers last night. Tmax 102 F. Has been vomiting and dry-heeving after coughing. No diarrhea.   Has tripped 4 times in the past week but didn't hit the floor.  He believes its due to his prostatic left lower leg but his daughter is unsure. Did not hit his head or have loss of consciousness.  He was able to get up on his own.    Has COPD and follows with pulmonology.         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   DM (diabetes mellitus), 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/2015   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   Disorder of subcutaneous tissue 04/25/2015   Angiopathy, 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   Cigarette smoker 04/25/2015    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    Past Surgical History:  Procedure Laterality Date   ABDOMINAL AORTIC ENDOVASCULAR STENT GRAFT  1996   APPENDECTOMY     CAROTID-SUBCLAVIAN BYPASS GRAFT Left 2001   CATARACT EXTRACTION W/PHACO Left 12/03/2017   Procedure: CATARACT EXTRACTION PHACO AND INTRAOCULAR LENS PLACEMENT (IOC) LEFT DIABETIC;  Surgeon: Nevada Crane, MD;  Location: [facility];  Service: Ophthalmology;  Laterality: Left;  Diabetic - oral meds   CATARACT EXTRACTION W/PHACO Right 03/04/2018   Procedure: CATARACT EXTRACTION PHACO AND INTRAOCULAR LENS PLACEMENT (IOC) RIGHT DIABETIC;  Surgeon: Nevada Crane, MD;  Location: [facility];  Service: Ophthalmology;  Laterality: Right;   COLONOSCOPY     CORONARY ANGIOPLASTY WITH STENT PLACEMENT  1992, 2004   FEMORAL-POPLITEAL BYPASS GRAFT Left 1996   LEG AMPUTATION BELOW KNEE Left 2014       Home Medications    Prior to Admission medications   Medication Sig Start Date End Date Taking? Authorizing Provider  albuterol (VENTOLIN HFA) 108 (90 Base) MCG/ACT inhaler Inhale 2 puffs into the lungs 4 (four) times daily as needed. 09/25/23  Yes Kariana Wiles, DO  azithromycin (ZITHROMAX) 250 MG tablet Take by mouth. 06/20/21  Yes [provider]  clopidogrel (PLAVIX) 75 MG tablet Take 75 mg by mouth daily. 02/18/21  Yes [provider]  ferrous gluconate (FERGON) 324 MG tablet Take 324 mg  by mouth daily. 06/27/17  Yes [provider]  Fluticasone-Umeclidin-Vilant 100-62.5-25 MCG/INH AEPB Inhale 25 mcg into the lungs daily.   Yes [provider]  glipiZIDE (GLUCOTROL XL) 5 MG 24 hr tablet Take 5 mg by mouth daily.   Yes [provider]  glucose blood (ONE TOUCH ULTRA TEST) test strip  06/01/13  Yes [provider]  hydroxychloroquine (PLAQUENIL) 200 MG tablet Take 1 tablet by mouth daily. 11/28/23 11/27/24 Yes [provider]  ipratropium-albuterol (DUONEB)  0.5-2.5 (3) MG/3ML SOLN Take 3 mLs by nebulization as needed.   Yes [provider]  levocetirizine (XYZAL) 5 MG tablet Take 1 tablet by mouth every evening. 10/21/20 12/11/23 Yes [provider]  levothyroxine (SYNTHROID) 75 MCG tablet Take by mouth. 09/23/19  Yes [provider]  metFORMIN (GLUCOPHAGE) 1000 MG tablet Take 1,000 mg by mouth 2 (two) times daily. 05/20/21  Yes [provider]  nitroGLYCERIN (NITROSTAT) 0.4 MG SL tablet Place under the tongue. 11/22/20 12/11/23 Yes [provider]  ondansetron (ZOFRAN) 4 MG tablet Take 1 tablet (4 mg total) by mouth every 6 (six) hours. 12/11/23  Yes Feliz Herard, Seward Meth, DO  oseltamivir (TAMIFLU) 75 MG capsule Take 1 capsule (75 mg total) by mouth every 12 (twelve) hours. 12/11/23  Yes Lazara Grieser, DO  pantoprazole (PROTONIX) 40 MG tablet Take 40 mg by mouth daily. 04/09/21  Yes [provider]  predniSONE (DELTASONE) 50 MG tablet Take 1 tablet (50 mg total) by mouth daily for 5 days. 12/11/23 12/16/23 Yes Hazelynn Mckenny, DO  roflumilast (DALIRESP) 500 MCG TABS tablet Take 500 mcg by mouth daily.   Yes [provider]  rosuvastatin (CRESTOR) 10 MG tablet Take 10 mg by mouth daily. 03/15/17 12/11/23 Yes [provider]  warfarin (COUMADIN) 7.5 MG tablet Take 7.5 mg by mouth daily.   Yes [provider]  chlorpheniramine-HYDROcodone (TUSSIONEX) 10-8 MG/5ML Take 5 mLs by mouth every 12 (twelve) hours as needed. 12/11/23   Katha Cabal, DO    Family History Family History  Problem Relation Age of Onset   CAD Mother    Hypothyroidism Mother    Cancer Father     Social History Social History   Tobacco Use   Smoking status: Former    Current packs/day: 0.00    Average packs/day: 1 pack/day for 50.0 years (50.0 ttl pk-yrs)    Types: Cigarettes    Start date: 11/23/1965    Quit date: 11/24/2015    Years since quitting: 8.0   Smokeless tobacco: Never  Vaping Use   Vaping  status: Never Used  Substance Use Topics   Alcohol use: No    Alcohol/week: 0.0 standard drinks of alcohol    Comment: may have 1 beer/month   Drug use: No     Allergies   Iodinated contrast media, Penicillins, and Shellfish-derived products   Review of Systems Review of Systems: negative unless otherwise stated in HPI.      Physical Exam Triage Vital Signs ED Triage Vitals  Encounter Vitals Group     BP 12/11/23 1410 126/75     Systolic BP Percentile --      Diastolic BP Percentile --      Pulse Rate 12/11/23 1410 90     Resp 12/11/23 1410 16     Temp 12/11/23 1410 99.5 F (37.5 C)     Temp Source 12/11/23 1410 Oral     SpO2 12/11/23 1410 91 %     Weight 12/11/23 1409 134  lb (60.8 kg)     Height 12/11/23 1409 5\' 8"  (1.727 m)     Head Circumference --      Peak Flow --      Pain Score 12/11/23 1413 0     Pain Loc --      Pain Education --      Exclude from Growth Chart --    No data found.  Updated Vital Signs BP 126/75 (BP Location: Right Arm)   Pulse 90   Temp 99.5 F (37.5 C) (Oral)   Resp 16   Ht 5\' 8"  (1.727 m)   Wt 60.8 kg   SpO2 91%   BMI 20.37 kg/m   Visual Acuity Right Eye Distance:   Left Eye Distance:   Bilateral Distance:    Right Eye Near:   Left Eye Near:    Bilateral Near:     Physical Exam GEN:     alert, non-toxic appearing male in no distress    HENT:  mucus membranes moist, oropharyngeal without lesions or erythema, no tonsillar hypertrophy or exudates, clear nasal discharge EYES:   no scleral injection or discharge NECK:   no lymphadenopathy, no meningismus   RESP:  no increased work of breathing, distant but clear to auscultation bilaterally CVS:   regular rate and rhythm Skin:   warm and dry, no rash on visible skin    UC Treatments / Results  Labs (all labs ordered are listed, but only abnormal results are displayed) Labs Reviewed  RESP PANEL BY RT-PCR (RSV, FLU A&B, COVID)  RVPGX2 - Abnormal; Notable for the  following components:      Result Value   Influenza A by PCR POSITIVE (*)    All other components within normal limits    EKG   Radiology DG Chest 2 View Result Date: 12/11/2023 CLINICAL DATA:  Cough, fever. EXAM: CHEST - 2 VIEW COMPARISON:  September 25, 2023. FINDINGS: The heart size and mediastinal contours are within normal limits. Hyperexpansion of the lungs is noted. Calcified granulomas are noted in left upper lobe. No acute pulmonary disease is noted. The visualized skeletal structures are unremarkable. IMPRESSION: Hyperexpansion of the lungs suggesting COPD. No acute cardiopulmonary abnormality seen. Electronically Signed   By: Lupita Raider M.D.   On: 12/11/2023 15:41    Procedures Procedures (including critical care time)  Medications Ordered in UC Medications - No data to display  Initial Impression / Assessment and Plan / UC Course  I have reviewed the triage vital signs and the nursing notes.  Pertinent labs & imaging results that were available during my care of the patient were reviewed by me and considered in my medical decision making (see chart for details).       Pt is a 83 y.o. male who has COPD presents for new fever with vomiting and respiratory symptoms. Thayden is afebrile here without recent antipyretics. Satting well on room air. Overall pt is non-toxic appearing, well hydrated, without respiratory distress. Pulmonary exam is unremarkable.  COVID and influenza panel obtained.  Chest xray personally reviewed by me without focal pneumonia, pleural effusion, cardiomegaly or pneumothorax. He does have some left upper lobe nodules that were noted to be calcified granulomas on his 09/25/23 chest xray.   Influenza A test is positive. Tamiflu prescribed and risks and benefits discussed.  Prednisone burst, Zofran and Tussinex also prescribed. Discussed symptomatic treatment.  Explained lack of efficacy of antibiotics in viral disease.  Typical duration of symptoms  discussed.  Return and ED precautions given and voiced understanding. Discussed MDM, treatment plan and plan for follow-up with patient who agrees with plan.   Radiologist impression reviewed   Final Clinical Impressions(s) / UC Diagnoses   Final diagnoses:  Respiratory illness with fever  Influenza A     Discharge Instructions      You have influenza A.  Tamiflu, a cough syrup and steroids were prescribed.  Your symptoms will gradually improve over the next 7 to 10 days.  The cough may last about 3 weeks.   Take Tylenol 1000 mg for fever, headache or body aches.   For cough:  Stop by the the pharmacy to pick up your prescription cough medication.You can use a humidifier for chest congestion and cough.  If you don't have a humidifier, you can sit in the bathroom with the hot shower running.      For sore throat: try warm salt water gargles, Mucinex sore throat cough drops or cepacol lozenges, throat spray, warm tea or water with lemon/honey, popsicles or ice, or OTC cold relief medicine for throat discomfort. You can also purchase chloraseptic spray at the pharmacy or dollar store.   For congestion: take a daily anti-histamine like Zyrtec, Claritin, and a oral decongestant, such as pseudoephedrine.  You can also use Flonase 1-2 sprays in each nostril daily. Afrin is also a good option, if you do not have high blood pressure.    It is important to stay hydrated: drink plenty of fluids (water, gatorade/powerade/pedialyte, juices, or teas) to keep your throat moisturized and help further relieve irritation/discomfort.    Return or go to the Emergency Department if symptoms worsen or do not improve in the next few days      ED Prescriptions     Medication Sig Dispense Auth. Provider   chlorpheniramine-HYDROcodone (TUSSIONEX) 10-8 MG/5ML Take 5 mLs by mouth every 12 (twelve) hours as needed. 115 mL Alphus Zeck, DO   oseltamivir (TAMIFLU) 75 MG capsule Take 1 capsule (75 mg  total) by mouth every 12 (twelve) hours. 10 capsule Flavia Bruss, DO   predniSONE (DELTASONE) 50 MG tablet Take 1 tablet (50 mg total) by mouth daily for 5 days. 5 tablet Ellyn Rubiano, DO   ondansetron (ZOFRAN) 4 MG tablet Take 1 tablet (4 mg total) by mouth every 6 (six) hours. 20 tablet Lisanne Ponce, Seward Meth, DO      I have reviewed the PDMP during this encounter.      Katha Cabal, DO 12/11/23 1723

## 2024-05-12 ENCOUNTER — Ambulatory Visit
Admission: EM | Admit: 2024-05-12 | Discharge: 2024-05-12 | Disposition: A | Discharge status: Home / Self Care | Attending: Physician Assistant | Admitting: Physician Assistant

## 2024-05-12 DIAGNOSIS — H1032 Unspecified acute conjunctivitis, left eye: Secondary | ICD-10-CM | POA: Diagnosis not present

## 2024-05-12 MED ORDER — MOXIFLOXACIN HCL 0.5 % OP SOLN: 1.0000 [drp] | Freq: Three times a day (TID) | OPHTHALMIC | 0 refills | Status: AC

## 2024-05-12 NOTE — ED Provider Notes (Signed)
 [facility]    CSN: [id number] Arrival date & time: 05/12/24  1628      History   Chief Complaint Chief Complaint  Patient presents with   Conjunctivitis    HPI Ruben HOLLERAN Sr. is a 83 y.o. male presenting with family member for left eye redness, irritation and sticky yellowish drainage that began today.  Denies pain, vision changes, headaches, dizziness, cough congestion or fever.  No known exposure to pinkeye.  Used an over-the-counter allergy relief drops which did not help.  Does not wear contacts or glasses.  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   DM (diabetes mellitus), 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/2015   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   Disorder of subcutaneous tissue 04/25/2015   Angiopathy, 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   Cigarette smoker 04/25/2015   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    Past Surgical History:  Procedure Laterality Date   ABDOMINAL AORTIC ENDOVASCULAR STENT GRAFT  1996   APPENDECTOMY     CAROTID-SUBCLAVIAN BYPASS GRAFT Left 2001   CATARACT EXTRACTION W/PHACO Left 12/03/2017   Procedure: CATARACT EXTRACTION PHACO AND INTRAOCULAR LENS  PLACEMENT (IOC) LEFT DIABETIC;  Surgeon: Myrna Adine Anes, MD;  Location: [facility];  Service: Ophthalmology;  Laterality: Left;  Diabetic - oral meds   CATARACT EXTRACTION W/PHACO Right 03/04/2018   Procedure: CATARACT EXTRACTION PHACO AND INTRAOCULAR LENS PLACEMENT (IOC) RIGHT DIABETIC;  Surgeon: Myrna Adine Anes, MD;  Location: [facility];  Service: Ophthalmology;  Laterality: Right;   COLONOSCOPY     CORONARY ANGIOPLASTY WITH STENT PLACEMENT  1992, 2004   FEMORAL-POPLITEAL BYPASS GRAFT Left 1996   LEG AMPUTATION BELOW KNEE Left 2014       Home Medications    Prior to Admission medications   Medication Sig Start Date End Date Taking? Authorizing Provider  albuterol  (VENTOLIN  HFA) 108 (90 Base) MCG/ACT inhaler Inhale 2 puffs into the lungs 4 (four) times daily as needed. 09/25/23  Yes Brimage, Vondra, DO  clopidogrel (PLAVIX) 75 MG tablet Take 75 mg by mouth daily. 02/18/21  Yes [provider]  ferrous gluconate (FERGON) 324 MG tablet Take 324 mg by mouth daily. 06/27/17  Yes [provider]  Fluticasone-Umeclidin-Vilant 100-62.5-25 MCG/INH AEPB Inhale 25 mcg into the lungs daily.   Yes [provider]  glipiZIDE (GLUCOTROL XL) 5 MG 24 hr tablet Take 5 mg by mouth daily.   Yes [provider]  hydroxychloroquine (PLAQUENIL) 200 MG tablet Take 1 tablet by mouth daily. 11/28/23 11/27/24 Yes [provider]  levothyroxine  (SYNTHROID ) 75 MCG tablet Take by mouth. 09/23/19  Yes [provider]  metFORMIN  (GLUCOPHAGE ) 1000 MG tablet Take 1,000 mg by  mouth 2 (two) times daily. 05/20/21  Yes [provider]  moxifloxacin  (VIGAMOX ) 0.5 % ophthalmic solution Place 1 drop into the left eye 3 (three) times daily for 7 days. 05/12/24 05/19/24 Yes Arvis Jolan NOVAK, PA-C  pantoprazole (PROTONIX) 40 MG tablet Take 40 mg by mouth daily. 04/09/21  Yes [provider]  rosuvastatin (CRESTOR) 10 MG tablet Take 10 mg by mouth  daily. 03/15/17 05/12/24 Yes [provider]  azithromycin (ZITHROMAX) 250 MG tablet Take by mouth. 06/20/21   [provider]  chlorpheniramine-HYDROcodone  (TUSSIONEX) 10-8 MG/5ML Take 5 mLs by mouth every 12 (twelve) hours as needed. 12/11/23   Brimage, Vondra, DO  glucose blood (ONE TOUCH ULTRA TEST) test strip  06/01/13   [provider]  ipratropium-albuterol  (DUONEB) 0.5-2.5 (3) MG/3ML SOLN Take 3 mLs by nebulization as needed.    [provider]  levocetirizine (XYZAL) 5 MG tablet Take 1 tablet by mouth every evening. 10/21/20 12/11/23  [provider]  nitroGLYCERIN (NITROSTAT) 0.4 MG SL tablet Place under the tongue. 11/22/20 12/11/23  [provider]  ondansetron  (ZOFRAN ) 4 MG tablet Take 1 tablet (4 mg total) by mouth every 6 (six) hours. 12/11/23   Brimage, Vondra, DO  oseltamivir  (TAMIFLU ) 75 MG capsule Take 1 capsule (75 mg total) by mouth every 12 (twelve) hours. 12/11/23   Brimage, Vondra, DO  roflumilast (DALIRESP) 500 MCG TABS tablet Take 500 mcg by mouth daily.    [provider]  warfarin (COUMADIN ) 7.5 MG tablet Take 7.5 mg by mouth daily.    [provider]    Family History Family History  Problem Relation Age of Onset   CAD Mother    Hypothyroidism Mother    Cancer Father     Social History Social History   Tobacco Use   Smoking status: Former    Current packs/day: 0.00    Average packs/day: 1 pack/day for 50.0 years (50.0 ttl pk-yrs)    Types: Cigarettes    Start date: 11/23/1965    Quit date: 11/24/2015    Years since quitting: 8.4   Smokeless tobacco: Never  Vaping Use   Vaping status: Never Used  Substance Use Topics   Alcohol use: No    Alcohol/week: 0.0 standard drinks of alcohol    Comment: may have 1 beer/month   Drug use: No     Allergies   Iodinated contrast media, Penicillins, and Shellfish-derived products   Review of Systems Review of Systems  Constitutional:  Negative for  fatigue and fever.  HENT:  Negative for congestion and facial swelling.   Eyes:  Positive for discharge and redness. Negative for photophobia, pain, itching and visual disturbance.  Neurological:  Negative for dizziness and headaches.     Physical Exam Triage Vital Signs ED Triage Vitals  Encounter Vitals Group     BP      Girls Systolic BP Percentile      Girls Diastolic BP Percentile      Boys Systolic BP Percentile      Boys Diastolic BP Percentile      Pulse      Resp      Temp      Temp src      SpO2      Weight      Height      Head Circumference      Peak Flow      Pain Score      Pain Loc  Pain Education      Exclude from Growth Chart    No data found.  Updated Vital Signs BP 118/67 (BP Location: Right Arm)   Pulse 64   Temp 97.6 F (36.4 C) (Oral)   Resp 16   SpO2 94%   Visual Acuity Right Eye Distance:  20/20 Left Eye Distance:  20/25 Bilateral Distance:  20/20  Physical Exam Vitals and nursing note reviewed.  Constitutional:      General: He is not in acute distress.    Appearance: Normal appearance. He is well-developed. He is not ill-appearing.  HENT:     Head: Normocephalic and atraumatic.     Nose: Nose normal.     Mouth/Throat:     Mouth: Mucous membranes are moist.     Pharynx: Oropharynx is clear.   Eyes:     General: Lids are normal. Lids are everted, no foreign bodies appreciated. Vision grossly intact. No scleral icterus.       Left eye: Discharge present.No hordeolum.     Extraocular Movements: Extraocular movements intact.     Conjunctiva/sclera:     Left eye: Left conjunctiva is injected.     Pupils: Pupils are equal, round, and reactive to light.     Comments: Mild swelling below left eye   Cardiovascular:     Rate and Rhythm: Normal rate.  Pulmonary:     Effort: Pulmonary effort is normal. No respiratory distress.   Musculoskeletal:     Cervical back: Neck supple.   Skin:    General: Skin is warm and dry.      Capillary Refill: Capillary refill takes less than 2 seconds.   Neurological:     General: No focal deficit present.     Mental Status: He is alert.     Motor: No weakness.   Psychiatric:        Mood and Affect: Mood normal.        Behavior: Behavior normal.      UC Treatments / Results  Labs (all labs ordered are listed, but only abnormal results are displayed) Labs Reviewed - No data to display  EKG   Radiology No results found.  Procedures Procedures (including critical care time)  Medications Ordered in UC Medications - No data to display  Initial Impression / Assessment and Plan / UC Course  I have reviewed the triage vital signs and the nursing notes.  Pertinent labs & imaging results that were available during my care of the patient were reviewed by me and considered in my medical decision making (see chart for details).   83 year old male presents to family member for left eye redness, irritation and drainage that began today.  Vision intact.  Patient overall well-appearing.  He has a little swelling and erythema below the left eye, injection of left conjunctiva with scant yellowish drainage.  Presentation consistent with bacterial conjunctivitis.  Treating at this time with Vigamox .  Supportive care encouraged.  Reviewed return precautions.   Final Clinical Impressions(s) / UC Diagnoses   Final diagnoses:  Acute bacterial conjunctivitis of left eye     Discharge Instructions      -Use antibiotic drops for infection -Cool compresses for swelling -Warm compresses for comfort as needed -Should be improving in a few days. If not or discomfort worsens return for re-evaluation     ED Prescriptions     Medication Sig Dispense Auth. Provider   moxifloxacin  (VIGAMOX ) 0.5 % ophthalmic solution Place 1 drop into the left eye  3 (three) times daily for 7 days. 3 mL Arvis Jolan NOVAK, PA-C      PDMP not reviewed this encounter.   Arvis Jolan NOVAK,  PA-C 05/12/24 1752

## 2024-05-12 NOTE — Discharge Instructions (Addendum)
-  Use antibiotic drops for infection -Cool compresses for swelling -Warm compresses for comfort as needed -Should be improving in a few days. If not or discomfort worsens return for re-evaluation

## 2024-05-12 NOTE — ED Triage Notes (Signed)
 Left eye drainage and itching x 1 day
